# Patient Record
Sex: Female | Born: 1935 | Race: White | Hispanic: No | Marital: Married | Smoking: Never smoker
Health system: Southern US, Community
[De-identification: ages and names within clinical notes are randomized; demographics above are authoritative.]

## PROBLEM LIST (undated history)

## (undated) DIAGNOSIS — Z9989 Dependence on other enabling machines and devices: Secondary | ICD-10-CM

## (undated) DIAGNOSIS — I2510 Atherosclerotic heart disease of native coronary artery without angina pectoris: Secondary | ICD-10-CM

## (undated) DIAGNOSIS — M1990 Unspecified osteoarthritis, unspecified site: Secondary | ICD-10-CM

## (undated) DIAGNOSIS — E669 Obesity, unspecified: Secondary | ICD-10-CM

## (undated) DIAGNOSIS — G4733 Obstructive sleep apnea (adult) (pediatric): Secondary | ICD-10-CM

## (undated) DIAGNOSIS — E119 Type 2 diabetes mellitus without complications: Secondary | ICD-10-CM

## (undated) DIAGNOSIS — I5032 Chronic diastolic (congestive) heart failure: Secondary | ICD-10-CM

## (undated) DIAGNOSIS — R0600 Dyspnea, unspecified: Secondary | ICD-10-CM

## (undated) DIAGNOSIS — E785 Hyperlipidemia, unspecified: Secondary | ICD-10-CM

## (undated) DIAGNOSIS — I10 Essential (primary) hypertension: Secondary | ICD-10-CM

## (undated) DIAGNOSIS — K219 Gastro-esophageal reflux disease without esophagitis: Secondary | ICD-10-CM

## (undated) HISTORY — DX: Type 2 diabetes mellitus without complications: E11.9

## (undated) HISTORY — DX: Obstructive sleep apnea (adult) (pediatric): G47.33

## (undated) HISTORY — DX: Unspecified osteoarthritis, unspecified site: M19.90

## (undated) HISTORY — DX: Hyperlipidemia, unspecified: E78.5

## (undated) HISTORY — PX: TOTAL KNEE ARTHROPLASTY: SHX125

## (undated) HISTORY — DX: Essential (primary) hypertension: I10

## (undated) HISTORY — DX: Dependence on other enabling machines and devices: Z99.89

## (undated) HISTORY — PX: BACK SURGERY: SHX140

## (undated) HISTORY — DX: Chronic diastolic (congestive) heart failure: I50.32

## (undated) HISTORY — DX: Dyspnea, unspecified: R06.00

## (undated) HISTORY — DX: Gastro-esophageal reflux disease without esophagitis: K21.9

## (undated) HISTORY — DX: Obesity, unspecified: E66.9

## (undated) HISTORY — DX: Atherosclerotic heart disease of native coronary artery without angina pectoris: I25.10

## (undated) HISTORY — PX: CHOLECYSTECTOMY: SHX55

---

## 2000-12-20 ENCOUNTER — Encounter: Payer: Self-pay | Admitting: Orthopedic Surgery

## 2000-12-26 ENCOUNTER — Inpatient Hospital Stay (HOSPITAL_COMMUNITY): Admission: RE | Admit: 2000-12-26 | Discharge: 2000-12-30 | Payer: Self-pay | Admitting: Orthopedic Surgery

## 2002-11-10 ENCOUNTER — Inpatient Hospital Stay (HOSPITAL_COMMUNITY): Admission: RE | Admit: 2002-11-10 | Discharge: 2002-11-14 | Payer: Self-pay | Admitting: Orthopedic Surgery

## 2007-06-14 ENCOUNTER — Inpatient Hospital Stay (HOSPITAL_COMMUNITY): Admission: AD | Admit: 2007-06-14 | Discharge: 2007-06-18 | Payer: Self-pay | Admitting: Cardiology

## 2007-06-14 ENCOUNTER — Ambulatory Visit: Payer: Self-pay | Admitting: Internal Medicine

## 2007-06-14 ENCOUNTER — Ambulatory Visit: Payer: Self-pay | Admitting: Cardiology

## 2007-06-17 ENCOUNTER — Encounter: Payer: Self-pay | Admitting: Cardiology

## 2009-03-10 ENCOUNTER — Encounter: Admission: RE | Admit: 2009-03-10 | Discharge: 2009-03-10 | Payer: Self-pay | Admitting: Neurological Surgery

## 2009-04-15 ENCOUNTER — Ambulatory Visit (HOSPITAL_COMMUNITY): Admission: RE | Admit: 2009-04-15 | Discharge: 2009-04-16 | Payer: Self-pay | Admitting: Neurological Surgery

## 2011-01-08 LAB — CBC
HCT: 38.5 % (ref 36.0–46.0)
Platelets: 216 10*3/uL (ref 150–400)
RBC: 4.26 MIL/uL (ref 3.87–5.11)
WBC: 7 10*3/uL (ref 4.0–10.5)

## 2011-01-08 LAB — BASIC METABOLIC PANEL
BUN: 25 mg/dL — ABNORMAL HIGH (ref 6–23)
BUN: 27 mg/dL — ABNORMAL HIGH (ref 6–23)
CO2: 34 mEq/L — ABNORMAL HIGH (ref 19–32)
Calcium: 10.5 mg/dL (ref 8.4–10.5)
Chloride: 89 mEq/L — ABNORMAL LOW (ref 96–112)
Chloride: 89 mEq/L — ABNORMAL LOW (ref 96–112)
Creatinine, Ser: 1.17 mg/dL (ref 0.4–1.2)
GFR calc Af Amer: 55 mL/min — ABNORMAL LOW (ref >60)
GFR calc non Af Amer: 45 mL/min — ABNORMAL LOW (ref >60)
Glucose, Bld: 115 mg/dL — ABNORMAL HIGH (ref 70–99)
Potassium: 5 mEq/L (ref 3.5–5.1)
Potassium: 5.1 mEq/L (ref 3.5–5.1)
Sodium: 130 mEq/L — ABNORMAL LOW (ref 135–145)

## 2011-02-14 NOTE — Discharge Summary (Signed)
[name], [name]                ACCOUNT NO.:  [id number]   MEDICAL RECORD NO.:  [id number]          PATIENT TYPE:  INP   LOCATION:  6525                         FACILITY:  [facility]   PHYSICIAN:  Veverly Fells. Excell Seltzer, MD  DATE OF BIRTH:  [birth year]   DATE OF ADMISSION:  06/14/2007  DATE OF DISCHARGE:  06/18/2007                               DISCHARGE SUMMARY   PRIMARY CARDIOLOGIST:  Dr. Rollene Rotunda.   PRIMARY CARE PHYSICIAN:  Dr. Lois Huxley, [facility].   DISCHARGE DIAGNOSES:  Chest pain.   SECONDARY DIAGNOSES:  1. Hypertension.  2. Gastroesophageal reflux disease.  3. Hypothyroidism.  4. Acute renal insufficiency.  5. Sleep apnea, previously on CPAP.  6. History of degenerative joint disease of the neck and shoulders.  7. Status post bilateral knee replacements.  8. Status post cholecystectomy.   ALLERGIES:  No known drug allergies.   PROCEDURE:  Left heart cardiac catheterization and 2-D echocardiogram.   HISTORY OF PRESENT ILLNESS:  A 77 year old Caucasian female without a  prior history of CAD. On September 12, she developed moderately severe  substernal chest discomfort with radiation down the left arm associated  with diaphoresis prompting her to call EMS when she was given sublingual  nitroglycerin with relief of discomfort. She was taken to [facility] where she had no acute ECG changes and cardiac markers were  within normal limits. She had recurrent chest discomfort prompting  transfer to [facility] for further evaluation.   HOSPITAL COURSE:  Here at [facility], she ruled out for MI and given her risk  factors was scheduled for left heart cardiac catheterization. She had  mild chest discomfort over the weekend relieved with additional  nitroglycerin. Notably on admission to [facility] she was found to have  a creatinine of 1.7. With IV hydration, her creatinine normalized and  has remained normal. She underwent left heart cardiac catheterization on  September  15 revealing normal coronary arteries. There is a 25% stenosis  in the right renal artery. A 2-D echocardiogram was also performed  revealing an EF of 60% with mild LVH and mildly dilated left atrium. She  has had no recurrent chest discomfort and will be discharged home today  in satisfactory condition. Of note, we held her diuretics during this  admission secondary to her acute renal insufficiency. She indicates that  she has been on Torsemide for years and she has never had problems with  renal issues until she was initiated on Hyzaar approximately 3 months  ago. We have continued the Cozaar but have discontinued her  hydrochlorothiazide.   DISCHARGE LABS:  Hemoglobin 11.6, hematocrit 34.3, WBC 6.1, platelets  162, MCV 87.5. Sodium 138, potassium 4.4, chloride 105, CO2 27, BUN 11,  creatinine 1.06, glucose 104. Total bilirubin 0.5, alkaline phosphatase  50, AST 30, ALT 21, albumin 3.3. CK 49, MB 0.9, troponin I 0.02. Total  cholesterol 128, triglycerides 101, HDL 36, LDL 72, calcium 9.3.  Magnesium 1.7.   DISPOSITION:  The patient is being discontinue home today in good  condition.   FOLLOWUP:  She is asked to  followup with primary care [facility] Mink, Dr.  Earlene Plater, in approximately 1-2 weeks. We have requested that she have a  BMET drawn in 1 week.   DISCHARGE MEDICATIONS:  1. Lisinopril 40 mg b.i.d.  2. Torsemide 20 mg daily.  3. Synthroid 125 mcg daily.  4. Atenolol 50 mg b.i.d.  5. Nexium 40 mg daily.  6. Cozaar 50 mg daily.  7. Aspirin 81 mg daily.  8. Tylenol p.r.n.  9. Multiple daily.  10.Vitamin E, calcium, hydrocodone all as previously prescribed.   OUTSTANDING LAB STUDIES:  None.   DURATION OF DISCHARGE ENCOUNTER:  60 minutes including physician time.      Nicolasa Ducking, ANP      Veverly Fells. Excell Seltzer, MD  Electronically Signed    CB/MEDQ  D:  06/18/2007  T:  06/18/2007  Job:  [id number]   cc:   Lois Huxley, MD

## 2011-02-14 NOTE — Op Note (Signed)
NAMEMADLYNN, LUNDEEN                ACCOUNT NO.:  [id number]   MEDICAL RECORD NO.:  [id number]          PATIENT TYPE:  INP   LOCATION:  3524                         FACILITY:  [facility]   PHYSICIAN:  Stefani Dama, M.D.  DATE OF BIRTH:  [birth year]   DATE OF PROCEDURE:  DATE OF DISCHARGE:                               OPERATIVE REPORT   PREOPERATIVE DIAGNOSIS:  Herniated nucleus pulposus, L4-L5, right with  right lumbar radiculopathy.   POSTOPERATIVE DIAGNOSIS:  Herniated nucleus pulposus, L4-L5, right with  right lumbar radiculopathy.   PROCEDURE:  Microdiskectomy, L4-L5, right with operating microscope,  microdissection technique.   SURGEON:  Barnett Abu, MD   FIRST ASSISTANT:  Coletta Memos, MD   ANESTHESIA:  General endotracheal.   INDICATIONS:  Keyshia is a 77 year old individual who has had significant  back and right lower extremity pain for a number of months now.  It is  only been getting worse despite efforts with conservative treatment and  passage of time.  She is advised regarding surgical decompression via  microdiskectomy at L4-L5 on her right.   PROCEDURE:  The patient was brought to the operating room, supine on the  stretcher.  After smooth induction of general endotracheal anesthesia,  she was turned prone .  Back was prepped with alcohol, and DuraPrep, and  draped in a sterile fashion.  Midline incision was created and carried  down to the lumbodorsal fascia, which was opened on the left side at the  L4-L5 level, identified positively with a radiograph.  Then, with the  laminar arch of L4 being identified, and laminotomy was created removing  the inferior marginal lamina of L4 up to and including a proximal  facetectomy on the medial aspect of L4-L5.  Once this was completed,  then the yellow ligament was within the common dural tube could be  identified.  On the lateral aspect of the common dural tube, the disk  space was known to have a bulge posteriorly and  inferiorly, ligament was  intact.  Gently dissecting and lifting the L5 nerve root immediately  underneath this and medial to it we could identify a fragment of disk as  poking through a random lying posterior longitudinal ligament.  With  gentle teasing, this was mobilized and then evacuated as a single  fragment.  Further identification yielded some other scraps of disk  material; however, the opening from which the disk had protruded was  very tight and close to the disk space itself.  Further probing yielded  no other fragments of disk.  At this point, we decided to complete the  procedure without doing a formal intradiskal diskectomy and with  hemostasis in the soft tissues being, the lumbodorsal fascia was closed  with #1 Vicryl in an interrupted fashion, 2-0 Vicryl was used in the  subcutaneous tissues, 3-0 Vicryl subcuticularly, and Dermabond was  placed on the skin.  Blood loss for this procedure is quite nil.     Stefani Dama, M.D.  Electronically Signed    HJE/MEDQ  D:  04/15/2009  T:  04/16/2009  Job:  [id number]

## 2011-02-14 NOTE — Consult Note (Signed)
[name], [name]                ACCOUNT NO.:  [id number]   MEDICAL RECORD NO.:  [id number]          PATIENT TYPE:  INP   LOCATION:  2920                         FACILITY:  [facility]   PHYSICIAN:  Rollene Rotunda, MD, FACCDATE OF BIRTH:  07/12/1932   DATE OF CONSULTATION:  06/17/2007  DATE OF DISCHARGE:                                 CONSULTATION   PROCEDURES:  Left heart catheterization/coronary arteriography and  abdominal aortogram.   INDICATIONS:  Evaluate patient with chest pain suggestive of unstable  angina.  She also has refractory hypertension with renal insufficiency  suggesting the possibility of renal artery stenosis.   PROCEDURAL NOTE:  Left heart catheterization performed via right femoral  artery.  The artery is cannulated using anterior wall puncture.  A #6  French arterial sheath was inserted via the modified Seldinger  technique.  Preformed Judkins and a pigtail catheter were utilized.  The  patient tolerated the procedure well, and left the lab in stable  condition.  Results:  Hemodynamics LV 151/20, AO 152/93.  Coronaries:  The left main was normal.  The LAD was large and wrapped the apex and  was normal.  There were three small diagonals which were normal.  The  circumflex in the AV groove had a 25% stenosis after first obtuse  marginal.  The first obtuse marginal was large and normal.  Second  obtuse marginal was moderate sized and normal.  The right coronary  artery was a large dominant vessel.  It was normal throughout its  course.  PDA was large and normal.  Left ventriculogram:  The left  ventricle was not injected though we crossed for pressures.  I did not  inject the LV gram secondary to renal insufficiency.  An abdominal  aortogram was obtained secondary to her renal insufficiency and  difficult to control hypertension.  This demonstrated minimal aortic  plaque in the infrarenal region above the bifurcation.  Her right renal  artery had a mild 25%  plaque, but was, otherwise, widely patent.   CONCLUSION:  Minimal coronary artery plaque.  Minimal renal artery  plaque.   PLAN:  No further cardiovascular workup is suggested.  She will continue  the medications as listed for control of her hypertension.  Will refer  her back to Dr. Earlene Plater for evaluation of nonanginal chest pain.      Rollene Rotunda, MD, [facility]  Electronically Signed     JH/MEDQ  D:  06/17/2007  T:  06/17/2007  Job:  [id number]   cc:   Lois Huxley, M.D.

## 2011-02-14 NOTE — Consult Note (Signed)
[name], [name]                ACCOUNT NO.:  [id number]   MEDICAL RECORD NO.:  [id number]          PATIENT TYPE:  INP   LOCATION:  2920                         FACILITY:  [facility]   PHYSICIAN:  Rollene Rotunda, MD, FACCDATE OF BIRTH:  Jun 01, 1932   DATE OF CONSULTATION:  06/14/2007  DATE OF DISCHARGE:                                 CONSULTATION   PRIMARY CARE PHYSICIAN:  Dr. Lois Huxley, [facility]   REASON FOR ADMISSION:  Evaluate patient with chest pain.   HISTORY OF PRESENT ILLNESS:  The patient is a pleasant 77 year old white  female without prior cardiac history.  She presents for evaluation of  chest discomfort that started Thursday a.m.Marland Kitchen  She was sitting quietly.  She had been helping her husband, who has had recent bypass.  She  developed severe substernal chest discomfort.  She described it as a  aching discomfort.  She was somewhat diaphoretic.  It was moderately  severe at 5/10.  It was not like a previous reflux.  Radiated down both  arms.  She called EMS and was given four baby aspirin.  She was  subsequently given nitroglycerin.  She had several doses and eventually  the pain eased away.  She was taken to [facility], where she was  not noted to have any acute EKG changes.  Enzymes were within normal  limits, though her troponin did slightly increase on serial  measurements.  She had recurrent chest discomfort in the evening last  night requiring IV morphine.  Because of this she is transferred.  On  transfer here she is having 2/10 chest discomfort.   The patient denies any recent history.  She walks with her husband a  little as he recovers from his surgery.  She has not been able to bring  on any chest discomfort with this.  She has not had any new shortness of  breath, PND or orthopnea.  She has had no palpitations, presyncope or  syncope.   PAST MEDICAL HISTORY:  Hypertension times 2 years, apparently difficult  to control, gastroesophageal reflux  disease, hypothyroidism, chronic  kidney disease (I am not sure of the etiology of this.  Her creatinine  appears to be about 1.7.)  Sleep apnea with C-PAP, neck and shoulder  chronic degenerative joint disease.   PAST SURGICAL HISTORY:  Bilateral knee replacements, cholecystectomy.   ALLERGIES:  None.   MEDICATIONS AT HOME:  Lisinopril 40 mg b.i.d., torsemide 20 mg daily,  Synthroid 125 mcg daily, atenolol 50 mg b.i.d., Nexium 40 mg daily,  Hyzaar 100/12.5 daily, aspirin 325 mg daily, Tylenol, multivitamin,  vitamin E, calcium, hydrocodone.   SOCIAL HISTORY:  The patient is married.  She has four children.  She  never smoked cigarettes.  She does not drink alcohol.   FAMILY HISTORY:  Noncontributory for early coronary artery disease.  Her  mother died at 28 from coronary disease.   REVIEW OF SYSTEMS:  As stated in HPI, otherwise negative for other  systems.   PHYSICAL EXAMINATION:  The patient is well-appearing and in no distress.  Blood pressure 130/52, heart  rate 61 and regular, afebrile, respiratory  rate 18.  HEENT:  Eyes unremarkable, pupils equal, round, reactive to  light, fundi not visualized, oral mucosa unremarkable.  NECK:  No  jugular distention at 45 degrees, carotid upstroke brisk and  symmetrical.  No bruits, no thyromegaly.  LYMPHATICS:  No cervical,  axillary, inguinal adenopathy.  LUNGS:  Clear to auscultation  bilaterally.  BACK:  No costovertebral angle tenderness.  CHEST:  Unremarkable.  HEART:  PMI not displaced or sustained, S1-S2 within  normal limits.  No S3, no S4, clicks, rubs, murmurs.  ABDOMEN:  Obese,  positive bowel sounds, normal in frequency and pitch, no bruits,  rebound, guarding or midline pulsatile mass.  No hepatomegaly, no  splenomegaly.  SKIN:  No rashes, no nodules.  EXTREMITIES:  2+ pulses  throughout, no edema, cyanosis, clubbing.  NEURO:  Oriented to person,  place, time, cranial nerves II-XII grossly intact, motor grossly intact.    LABS:  WBC 5.6, hemoglobin 13.1, platelets 182, sodium 138, potassium  3.7, BUN 29, creatinine 1.7, CK-MB x2 negative.   CHEST X-RAY:  Preliminary in the hospital from [facility] was normal.   EKG:  EKG on admission:  Sinus rhythm, rate 60, axis within normal  limits, intervals within normal limits, poor anterior R-wave  progression, no acute ST-T wave changes.   ASSESSMENT/PLAN:  1. Chest.  The patient's chest discomfort is very worrisome for      cardiac etiology.  Because of this and the recurrent nature of it,      she needs cardiac catheterization, which will be done electively      unless she has an urgent indication.  She will be treated with      heparin, IV nitroglycerin, beta blockers and aspirin.  She will      continue to have enzymes cycled.  Would check an a.m. EKG.  2. Renal insufficiency.  The patient will need careful attention paid      to her renal insufficiency and hydration prior to the procedure.  I      will order an echocardiogram for Monday morning prior to the cath      and will try to avoid a left ventriculogram.  I will continue the      ACE and the ARB now as I think she needs this for blood pressure      control.  I would suggest an abdominal aortogram to assess her      renal arteries given the recent onset severe hypertension and the      renal insufficiency.  3. Hypothyroidism.  She will be continued on the Synthroid.  Will      check a TSH if one has not been ordered.  4. Hypertension.  This be managed with meds as listed.  Hold her      diuretic and hydrochlorothiazide for now.  5. Sleep apnea.  She will be watched for this, but we might write for      C-PAP as needed.      Rollene Rotunda, MD, [facility]  Electronically Signed     JH/MEDQ  D:  06/14/2007  T:  06/15/2007  Job:  [id number]

## 2011-02-17 NOTE — Discharge Summary (Signed)
NAME:  [name], [name]                     ACCOUNT NO.:  [id number]   MEDICAL RECORD NO.:  [id number]                   PATIENT TYPE:  INP   LOCATION:  0474                                 FACILITY[facility]  [facility]   PHYSICIAN:  Ollen Gross, M.D.                 DATE OF BIRTH:  1932-01-12   DATE OF ADMISSION:  11/10/2002  DATE OF DISCHARGE:  11/14/2002                                 DISCHARGE SUMMARY   ADMISSION DIAGNOSES:  1. Osteoarthritis right knee.  2. Status post left total knee replacement arthroplasty in 3/02.  3. Hypertension.  4. Hypothyroidism.  5. Osteoporosis.  6. Postmenopausal.   DISCHARGE DIAGNOSES:  1. Osteoarthritis right knee.  2. Status post right total knee replacement arthroplasty.  3. Hypertension.  4. Hypothyroidism.  5. Osteoporosis.  6. Postmenopausal.   PROCEDURE:  On 11/10/02 the patient underwent a right total knee arthroplasty.   SURGEON:  Dr. Lequita Halt.   ASSISTANT:  Alexzandrew L. Perkins, P.A.-C.   ANESTHESIA:  Spinal anesthesia.   BLOOD LOSS:  Minimal.  Hemovac Drain x1.   TOURNIQUET TIME:  42 minutes at 300 mm Hg.   CONSULTATIONS:  None.   HISTORY:  A 77 year old female well known to Dr. Lequita Halt who had  improvements on her left total knee back in 3/02.  She has done quite well  on her left knee.  Unfortunately she had ongoing right knee pain.  She has  undergone injections which only provided short term relief.  She toughed it  out through the holidays.  She has reached it out where she would like to  have something done.  X-rays show significant arthritis and she is  subsequently admitted to the hospital for knee replacement.  The risks and  benefits were discussed.  The patient has elected to proceed with surgery.   LABORATORY DATA:  CBC  on admission had a hemoglobin of 13.0, hematocrit of  38.0, white cell count of 5.5, red cell count of 4.32.  Postoperative H&H  12.7 and 37.4.  Last noted H&H 10.8 and 31.4.  Differential on  admission CBC  all within normal limits.  PT and PTT on admission 12.3 and 30 respectively.  The INR was 0.9.  __________ followed.  The last noted PT INR 21.8 and 2.2.  Chemistry panel on admission all within normal limits.  The patient had a  drop in sodium postoperatively from 143 of 135.  It was last noted at 132.  Glucose went up from 107 to 169 back down to 135.  Urinalysis on admission  showed only trace leukocyte esterase and 0-2 white cells, a few bacteria.  Blood type A positive.  I do not see a chest x-ray or EKG on this chart.   HOSPITAL COURSE:  The patient was admitted to [facility] and taken  to the OR.  She underwent the above procedure without complications.  The  patient tolerated the procedure  well.  She was given PCA and p.o. analgesics  for pain control following surgery.  She was given 24 hours postoperative  unit IV antibiotics and postoperative Coumadin for DVT prophylaxis.  Hemovac  drain placed at surgery was pulled on postoperative day #1.  Dressing change  was initiated on postoperative day #2.  The incision was healing well.  The  patient progressed fairly well with physical therapy and was up ambulating  44 feet by postoperative day #2, then 50 feet that afternoon, then up to 100  and 140 feet by postoperative day #3.  She progressed very well with  physical therapy.  Arrangements were made for home therapy.  By 11/14/02 she  was doing quite well, progressed with therapy, and was ready to go home.  Discharge medications were planned.  The patient was discharged home on  11/14/02.   DISCHARGE MEDICATIONS:  1. Coumadin.  2. Percocet.  3. Robaxin.   DISCHARGE DIET:  Low sodium diet.   FOLLOW UP:  Two weeks from surgery.   DISCHARGE ACTIVITIES:  Weight-bearing as tolerated.  Total knee protocol.  Home PT nursing through [facility].  She may start showering.   DISPOSITION:  Home.   CONDITION ON DISCHARGE:  Improved.      Alexzandrew L.  Julien Girt, P.A.              Ollen Gross, M.D.    ALP/MEDQ  D:  12/15/2002  T:  12/15/2002  Job:  [id number]

## 2011-02-17 NOTE — Op Note (Signed)
NAME:  RAELYNN, CORRON                     ACCOUNT NO.:  [id number]   MEDICAL RECORD NO.:  [id number]                   PATIENT TYPE:  INP   LOCATION:  0005                                 FACILITY:  [facility]   PHYSICIAN:  Ollen Gross, M.D.                 DATE OF BIRTH:  Aug 02, 1932   DATE OF PROCEDURE:  11/10/2002  DATE OF DISCHARGE:                                 OPERATIVE REPORT   PREOPERATIVE DIAGNOSIS:  Osteoarthritis of her right knee.   POSTOPERATIVE DIAGNOSIS:  Osteoarthritis of her right knee.   PROCEDURE:  Right total knee arthroplasty.   SURGEON:  Ollen Gross, M.D.   ASSISTANT:  Alexzandrew L. Julien Girt, P.A.   ANESTHESIA:  Spinal.   ESTIMATED BLOOD LOSS:  Minimal.   DRAINS:  Hemovac x1.   TOURNIQUET TIME:  42 minutes at 300 mmHg.   COMPLICATIONS:  None.   CONDITION:  Stable to recovery.   BRIEF CLINICAL NOTE:  The patient is a 77 year old female with severe end-  stage osteoarthritis of her right knee with pain refractory to nonoperative  management.  She presents now for right total knee arthroplasty.   DESCRIPTION OF PROCEDURE:  After the successful administration of a spinal  anesthetic, a tourniquet was placed high in the right thigh, the right lower  extremity prepped and draped in the usual sterile fashion.  The extremity  was wrapped in an Esmarch, knee flexed, and tourniquet inflated to 350 mmHg.  Standard midline incision was made with a 10 blade through subcutaneous  tissue to the level of the extensor mechanism.  A fresh blade was used to  make a medial parapatellar arthrotomy, and then the soft tissue over the  proximal and medial tibia was subperiosteally elevated at the joint line  with a knife and into the semimembranosus bursa with a curved osteotome.  Soft tissue over the proximal lateral tibia was also elevated with attention  being paid to avoid the patellar tendon on the tibial tubercle.  The patella  was everted and the knee  flexed 90 degrees, ACL and PCL removed.  A drill  was used to create a starting hole in the distal femur.  The canal was  irrigated and a 5 degree right valgus alignment guide placed.  Referencing  off the posterior condyles, the rotation was marked and the block pinned to  remove 10 mm off the distal femur.  Distal femoral resection was made with  an oscillating saw.   A sizing block is placed and size 3 is most appropriate.  Referencing off  the epicondylar axis, rotation is marked and a block pinned to make the  anterior and posterior cuts.  The cuts are subsequently made and then the  tibia is subluxed forward.  The menisci are removed and the extramedullary  tibial alignment guide is placed.  Referencing proximally to the medial  aspect of the tibial tubercle and distally along the second  metatarsal axis  and tibial crest, the block is pinned to remove 10 mm off the nondeficient  lateral side.  Tibial resection is made with an oscillating saw and size is  the most appropriate tibial tray.  The proximal tibia is then prepared with  a modular drill and keel punch.  The femoral preparation was then completed  with the intercondylar and chamfer block, and those cuts are subsequently  made.   A size 3 mobile bearing tibial tray trial, size 3 posterior-stabilized  femoral trial, and a 10 mm posterior-stabilized rotating platform insert  trial were placed.  Full extension is achieved with excellent varus and  valgus balance throughout the full range of motion.  The patella thickness  is measured and is 22 mm, and then free-hand resection is made with an  oscillating saw to 12 mm.  The 38 template is placed, lug holes drilled,  trial patella placed, and it tracks normally.   The trials removed with the exception of the femoral trial.  With that in  place, the osteophytes are removed off the posterior femur.  All trials are  removed and then the cut bone surfaces prepared with pulsatile  lavage.  Cement is mixed and once ready for implantation, a size 3 mobile bearing  tibial tray, size 3 posterior-stabilized femur, and 38 patella are cemented  into place.  The patella is held with a clamp.  The 10 mm trial is placed  and the knee held in full extension and all extruded cement removed.  Once  cement is fully hardened, then the permanent 10 mm posterior-stabilized  rotating platform insert is placed.  The wound is copiously irrigated with  antibiotic solution and the extensor mechanism closed over a Hemovac drain  with interrupted #1 PDS suture.  Flexion against gravity is 130 degrees.  The tourniquet is then released for a total time of 42 minutes.  The subcu  is closed with interrupted 2-0 Vicryl and subcuticular running 4-0 Monocryl.  The incision is cleaned and dried and Steri-Strips and a bulky sterile  dressing applied.  The patient is then awakened and transported to recovery  in stable condition.                                               Ollen Gross, M.D.    FA/MEDQ  D:  11/10/2002  T:  11/10/2002  Job:  [id number]

## 2011-02-17 NOTE — H&P (Signed)
[facility]  Patient:    [name], [name]                         MRN: [id number] Adm. Date:  12/20/00 Attending:  Ollen Gross, M.D. Dictator:   Ollen Gross, M.D. CC:         Dr. Jolyne Loa, [facility]   History and Physical  CHIEF COMPLAINT:  Bilateral knee pain.  HISTORY OF PRESENT ILLNESS:  The patient is a 77 year old female who has been seen in consultation by Dr. Ollen Gross for bilateral knee pain.  She is found to have bilateral osteoarthritis, the left is more symptomatic than the right.  This has been ongoing for over a year now.  She is seen in the office and x-rays show pretty significant joint space narrowing medially of the left knee and also the right.  It is more pronounced in the left knee.  She also has some osteophyte formation with bone on bone changes the patellar femoral joints bone knees.  She has tried anti-inflammatories in the past and she has also undergo interarticular injections as per Dr. Lequita Halt.  The injections only helped for a very short period of time and she wishes to have something done about her continued pain which has been ongoing.  The pain has started to interfere with her daily activities and her lifestyle.  Risk and benefits of total knee procedure have been discussed with the patient and she has elected to proceed with a left total knee replacement arthroplasty.  Surgery will be performed by Dr. Ollen Gross.  ALLERGIES:  No known drug allergies.  CURRENT MEDICATIONS: 1. Synthroid .112 mg daily. 2. Actonel 5 mg daily. 3. Spironolactone 25 mg daily. 4. Atenolol 25 mg 1/2 tablet daily. 5. Tylenol Arthritis p.r.n.  PAST MEDICAL HISTORY:  Hypertension, hypothyroidism, osteoporosis and arthritis.  PAST SURGICAL HISTORY:  Cholecystectomy back in July of 1999 and also tubal ligation.  SOCIAL HISTORY:  She is married with four children.  Denies the use of tobacco products or  alcohol products.  She lives in a two-story home.  She does have a ramp entering into the home.  FAMILY HISTORY:  Father living age 60 with diabetes.  Mother living age 72 with hypertension.  REVIEW OF SYSTEMS:  General:  No fever, chills or night sweats.  Neurologic: No seizures, syncope or paralysis.  Respiratory:  Shortness of breath, no productive cough or hemoptysis.  Cardiovascular:  No chest pain, angina, orthopnea.  GI:  No nausea, vomiting or diarrhea, constipation or bloody mucous in the stool.  GU:  No dysuria, hematuria, or discharge. Musculoskeletal:  Pertinent that the bilateral knees found in the history of present illness.  PHYSICAL EXAMINATION:  VITAL SIGNS:  Pulse 68, respirations 20, blood pressure 158/90.  GENERAL:  The patient is a 77 year old white female, short in stature, slightly overweight, in no acute distress.  She is alert, oriented and cooperative and very pleasant at time of exam.  She appears to be an average historian.  HEENT:  Normocephalic, atraumatic.  Pupils are round and reactive.  Patient is noted to wear glasses.  Oropharynx is clear.  She is noted to have upper and lower partial dentures.  NECK:  Supple, no carotid bruits.  CHEST:  Clear to auscultation.  HEART:  Regular rate and rhythm, no murmur, S1, S2 noted.  ABDOMEN:  Soft, round, nontender.  Bowel sounds are present.  No rebound or guarding.  BREAST AND GENITALIA:  Not done, not pertinent to present illness.  LEFT LOWER EXTREMITY:  She has range of motion of 5 to 115 degrees of passive range of motion.  She has marked crepitus noted on exam of the left knee. Ligaments do appear stable.  The right knee is noted to have 0 to 115 passive range of motion with marked crepitus and also stable ligaments.  X-RAYS:  X-rays do reveal joint space narrowing medially both in the left and right knee, more pronounced in the left.  She also has some osteophyte formation in bone on bone  changes the patellar femoral compartment on the lateral views.  IMPRESSION: 1. Bilateral knee osteoarthritis, left more symptomatic than right. 2. Hypertension. 3. Hypothyroidism. 4. Osteoporosis.  PLAN:  Patient will be admitted to [facility] to undergo a left total knee replacement arthroplasty.  Surgery is to be performed by Dr. Ollen Gross. DD:  12/21/00 TD:  12/22/00 Job: [id number]

## 2011-02-17 NOTE — Op Note (Signed)
[facility]  Patient:    [name], [name]                  MRN: [id number] Proc. Date: 12/26/00 Adm. Date:  [id number] Attending:  Ollen Gross V                           Operative Report  PREOPERATIVE DIAGNOSIS:  Osteoarthritis, left knee.  POSTOPERATIVE DIAGNOSIS:  Osteoarthritis, left knee.  PROCEDURE:  Left total knee arthroplasty.  SURGEON:  Dr. Lequita Halt.  ASSISTANT:  Avel Peace.  ANESTHESIA:  Spinal.  ESTIMATED BLOOD LOSS:  Minimal.  DRAINS:  Hemovac x 1.  TOURNIQUET TIME:  56 minutes at 350 mmHg.  COMPLICATIONS:  None.  CONDITION:  Stable to recovery.  BRIEF CLINICAL NOTE:  Ms. Snoke is a 77 year old female who had significant osteoarthritis to the left knee with pain refractory to non-operative management. She presents now for left total knee arthroplasty.  DESCRIPTION OF PROCEDURE:  After successful administration of spinal anesthetic, a tourniquet was placed high on the left thigh and left lower extremity prepped and draped in the usual sterile fashion. Extremities wrapped in Esmarch, knee flexed and tourniquet inflated to 350 mmHg. A midline incision is made, skin cut through a large thick layer of subcu tissue down to the extensor mechanism. A medial parapatellar arthrotomy is made with a fresh knife. The soft tissue over the proximal medial tibia subperiosteally elevated to the joint line with the knife and at the semimembranosus bursa with a curved osteotome. The soft tissue over the proximal lateral tibia is also elevated with attention being paid to avoid the patella tendon the tibial tubercle. The lateral patellofemoral ligament is cut, patella everted and knee flexed to 90 degrees. The ACL and PCL were then removed.  A drill was used to create a hole in the distal femur with the alignment guide. The canal was irrigated and a 5 degree left valgus alignment was placed. Referencing off the posterior condyle, rotation  is marked and 9 mm removed off the distal femur. A sizing block is placed and a size 3 is most appropriate. ______ external rotation guide is placed up the posterior condyle and this corresponds with her epicondylar axis. The size 3 block is placed and the anterior and posterior cuts made.  Tibia subluxed forward, menisci removed and extramedullary tibial alignment guide placed. Referencing proximally at the medial third of the tibial tubercle and distally along the tibial crest and second metatarsal axis is blocked, it was pinned to removed 10 mm off the nondeficient lateral side. Tibial resection is made with an oscillating saw. The chamfer block is then placed on the distal femur, intercondylar and chamfer cuts made. Trial size 3 posterior stabilized femur, size 3 mobile bearing tibial tray and a 10 mm posterior stabilized mobile bearing insert placed. Full extension achieved with excellent varus and valgus balance down past 100 degrees of flexion. The patella was then everted, thickness measured to be 22 and free hand resection taken down to 12 mm. A 38 mm template is placed and the lug holes are drilled. A 38 mm trial is placed and tracks perfectly.  The proximal tibia is prepared with the modular drill and keel punch and the osteophytes removed off the posterior femur with the trial in place. The cut bone surfaces are then prepared with pulsatile lavage, cement mixed and once ready for implantation, size 3 mobile bearing tibial tray, size 3 posterior  stabilized femur and a 38 mm patella are cemented into place and the patella is held with the clamp. A 10 mm posterior stabilized rotating platform trial is placed, knee held with full extension and all extruded cement removed. Once the cement is hardened, the permanent size 3 posterior stabilized mobile bearing insert is placed into the tibial tray. The wound is then copiously irrigated with antibiotic solution and extensor mechanism  closed over Hemovac drain with #1 PDS interrupted. The tourniquet is released with a total time of 56 minutes. The subcu is closed in two layers with interrupted 2-0 Vicryl, subcuticular with running 4-0 monocryl. The incision is cleaned and dried and Steri-Strips with a bulky sterile applied. Drains hooked to suction, knee immobilizer placed, patient awakened and transported to recovery room in stable condition. DD:  12/26/00 TD:  12/27/00 Job: [id number]

## 2011-02-17 NOTE — H&P (Signed)
NAME:  [name], [name]                     ACCOUNT NO.:  [id number]   MEDICAL RECORD NO.:  [id number]                   PATIENT TYPE:  INP   LOCATION:  0474                                 FACILITY:  [facility]   PHYSICIAN:  Ollen Gross, M.D.                 DATE OF BIRTH:  08/05/1932   DATE OF ADMISSION:  11/10/2002  DATE OF DISCHARGE:                                HISTORY & PHYSICAL   DATE OF OFFICE VISIT HISTORY AND PHYSICAL:  November 04, 2002.   CHIEF COMPLAINT:  Right knee pain.   HISTORY OF PRESENT ILLNESS:  The patient is a 77 year old female known to  Dr. Lequita Halt having previously undergone a left total knee replacement  arthroplasty back in March 2002.  She has done well with her left knee  replacement but has been followed recently for ongoing right knee pain.  The  right knee pain has been markedly increased.  She has undergone  intraarticular injections in the past with only a short-term temporary  relief.  She has toughed it out through the holidays and now has had a big  increase in her pain which is also not only painful during the day but  throughout the night.  She has reached a point where she would like to have  something done about it.  She comes in.  X-rays show significant medial  joint space narrowing with patellofemoral spurring.  It is felt she has  reached a point where she would benefit from undergoing knee replacement.  The risks and benefits have been discussed with the patient, and she has  elected to proceed with surgery.   ALLERGIES:  No known drug allergies.   CURRENT MEDICATIONS:  1. Synthroid 0.112 mg 1 p.o. daily.  2. Spironolactone 25 mg 2 tablets daily.  3. Atenolol 50 mg 1 tablet daily.  4. Actonel 35 mg 1 tablet weekly on Sunday.   PAST MEDICAL HISTORY:  1. Hypertension.  2. Hypothyroidism.  3. Osteoporosis.  4. Postmenopausal.   PAST SURGICAL HISTORY:  1. Gallbladder surgery in 1999 and also in July 2000 for gallstones.  2.  Left knee replacement March 2002.   SOCIAL HISTORY:  She has been married approximately 51 years.  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  Nonsmoker.  No alcohol.  Has four children.  Her husband will be assisting with her care after surgery.  One-story home.  She does have a ramp entering into her home.   FAMILY HISTORY:  Father deceased age 88, history of diabetes.  Mother, age  98, history of hypertension and arthritis.  She has a sister with breast  cancer, a brother with colon cancer.   REVIEW OF SYSTEMS:  GENERAL:  No fevers, chills, or night sweats.  NEUROLOGIC:  No seizures, syncope, or paralysis.  RESPIRATORY:  No shortness  of breath, productive cough, or hemoptysis.  CARDIOVASCULAR:  No chest pain,  angina, or orthopnea.  GASTROINTESTINAL:  No nausea, vomiting, diarrhea, or  constipation.  GENITOURINARY:  No dysuria, hematuria, or discharge.  MUSCULOSKELETAL:  Pertinent to that of the right knee found in the history  of present illness.   PHYSICAL EXAMINATION:  VITAL SIGNS:  Pulse 60, respirations 12, blood  pressure 162/84.  GENERAL:  The patient is a 77 year old female.  Well-nourished, well-  developed, short in stature.  In no acute distress.  She is alert, oriented,  and cooperative.  Very pleasant at the time of exam.  Appears to be an  average historian.  HEENT:  Normocephalic, atraumatic.  Pupils round and reactive.  The patient  is noted to wear glasses.  Oropharynx clear.  She does have upper and lower  dentures noted.  NECK:  Supple.  No carotid bruits.  CHEST:  Clear to auscultation anterior and posterior chest walls.  No  rhonchi, rales, or wheezing.  HEART:  Regular rate and rhythm.  No murmur.  S1, S2 noted.  ABDOMEN:  Soft and nontender.  Bowel sounds are present.  No rebound or  guarding.  RECTAL, BREASTS, GENITALIA:  Not done.  Not pertinent to present illness.  EXTREMITIES:  Limited to that of the right lower extremity.  The right knee  is found  to have range of motion of 10-105 degrees.  Marked crepitus is  noted on passive range of motion.  There is no instability.  The knee is  stable with varus and valgus stressing.   LABORATORY DATA:  X-rays show significant medial joint space narrowing with  patellofemoral spurring.   IMPRESSION:  1. Osteoarthritis right knee.  2. Status post left total knee replacement arthroplasty March 2002.  3. Hypertension.  4. Hypothyroidism.  5. Osteoporosis.  6. Postmenopausal.   PLAN:  The patient will be admitted to [facility] to undergo a  right total knee replacement arthroplasty.  Surgery will be performed by Dr.  Ollen Gross.     Alexzandrew L. Julien Girt, P.A.              Ollen Gross, M.D.    ALP/MEDQ  D:  11/10/2002  T:  11/10/2002  Job:  [id number]   cc:   Wannetta Sender.  [address]  [place]  [place] [address]  Fax: [phone]

## 2011-02-17 NOTE — Discharge Summary (Signed)
[facility]  Patient:    [name], [name]                  MRN: [id number] Adm. Date:  [id number] Disc. Date: [id number] Attending:  Loanne Drilling Dictator:   Alexzandrew L. Perkins, P.A.-C.                           Discharge Summary  ADMITTING DIAGNOSES: 1. Bilateral knee osteoarthritis, left more symptomatic than right. 2. Hypertension. 3. Hypothyroidism. 4. Osteoporosis.  DISCHARGE DIAGNOSES: 1. Osteoarthritis, left knee, status post left total knee replacement and    arthroplasty. 2. Osteoarthritis, right knee. 3. Hypertension. 4. Hypothyroidism. 5. Osteoporosis.  DESCRIPTION OF PROCEDURE:  Patient was taken to the OR on December 26, 2000, underwent a left total knee replacement arthroplasty.  Surgeon - Dr. Homero Fellers Aluisio.  Assistant - Patrica Duel, P.A.-C.  Surgery done under spinal anesthesia.  Hemovac drain x 1 placed at time of surgery.  Tourniquet time 56 minutes at 350 mmHg.  CONSULTS:  None.  HISTORY OF PRESENT ILLNESS:  Patient is a 77 year old female who had been seen in consultation by Dr. Lequita Halt for bilateral knee pain.  The left is more symptomatic than the right at this time.  The knee pain has been going on for over a year now.  She has been seen in the office where x-rays showed pretty significant joint space narrowing medially on the left knee and also on the right.  X-ray changes are more pronounced on the left with osteophyte formation and bone-on-bone changes in the patellofemoral joints of both knees who has been on anti-inflammatories in the past and has also undergone intra-articular injections.  The injections only help for a short period of time.  She has continued with pain and has elected to proceed with total knee replacement arthroplasty.  LABORATORY DATA:  CBC on admission showed a hemoglobin of 13.4, hematocrit 39.8, white cell count 7.6, red cell count 4.41.  Differential within normal limits.   Serial H&Hs were followed throughout the hospital course. Postoperative H&H was 11.3 and 33.8.  Last noted H&H was 10.1 and 29.8.  PT, PTT on admission were 13.1 and 29 respectively with an INR of 1.0.  Serial pro times followed.  Last noted PT-INR were 20.2 and 2.2 respectively.  Chemistry panel on admission:  Slightly elevated glucose of 136, remaining chemistry panel all within normal limits.  BMET taken two more times on December 27, 2000, and December 28, 2000, were within normal limits with the exception of mildly elevated glucose of 142 and 116 respectively.  Urinalysis on admission:  Small leukocyte esterase with only 0 to 5 white cells per high-power field, otherwise negative.  Blood group type A positive.  I do not see a chest x-ray or an EKG report on this chart volume.  HOSPITAL COURSE:  Patient was admitted to [facility], taken to the OR, underwent the above stated procedure without complication.  Patient tolerated the procedure well, later transferred to the recovery room and, then, to the orthopedic floor, continued postoperative care.  Hemovac drain was pulled on postoperative day #1.  She did have some mild fluid overload and she underwent mild diuresis.  Electrolytes were followed.  Electrolytes remained within normal limits.  She was placed on PCA analgesics for pain control following surgery and underwent 24 hours of IV antibiotics.  PCA and IV were discontinued on postoperative day #2.  Dressing changes  were initiated.  On postoperative day 2, wound appeared to be healing well.  It was noted on postoperative day #3 that she had a small decubitus ulceration on the right buttock.  DuoDerm was placed at this area.  Physical therapy was consulted postoperatively to assist with gait training and ambulation. Patient did very well with physical therapy and was up ambulating approximately 65 feet by postoperative day #3 and greater than 90 feet prior to discharge.  She  continued to progress well, had been weaned over to p.o. analgesics and, by postoperative day #4, she was doing quite well and ready to go home.  DISCHARGE MEDICATIONS/PLAN: 1. Patient is discharged home on December 30, 2000. 2. Discharge diagnosis:  Please see above. 3. Discharge medications:    a. Percocet #40 p.r.n. pain.    b. Robaxin 500 mg #40 p.r.n. spasm.    c. Coumadin.    d. Trinsicon, #63, t.i.d. for three more weeks.    e. Phenergan, #15, p.r.n. nausea. 4. Diet:  Low-sodium diet. 5. Activity:  Home health physical therapy and home health nursing for    prothrombin time draws.  Prothrombin times will be faxed to Dr. Lequita Halt.    He will be monitoring her Coumadin for three weeks.  She is also    weightbearing as tolerated.  FOLLOW-UP:  Follow up with Dr. Lequita Halt two weeks from date of surgery.  DISPOSITION:  Home.  CONDITION ON DISCHARGE:  Improved. DD:  02/05/01 TD:  02/06/01 Job: [id number]

## 2011-06-13 ENCOUNTER — Other Ambulatory Visit: Payer: Self-pay | Admitting: Family Medicine

## 2011-06-13 DIAGNOSIS — N63 Unspecified lump in unspecified breast: Secondary | ICD-10-CM

## 2011-06-19 ENCOUNTER — Ambulatory Visit
Admission: RE | Admit: 2011-06-19 | Discharge: 2011-06-19 | Disposition: A | Discharge status: Home / Self Care | Payer: Medicare Other | Source: Ambulatory Visit | Attending: Family Medicine | Admitting: Family Medicine

## 2011-06-19 ENCOUNTER — Other Ambulatory Visit: Payer: Self-pay | Admitting: Family Medicine

## 2011-06-19 ENCOUNTER — Other Ambulatory Visit: Payer: Self-pay | Admitting: Diagnostic Radiology

## 2011-06-19 DIAGNOSIS — N63 Unspecified lump in unspecified breast: Secondary | ICD-10-CM

## 2011-06-22 ENCOUNTER — Inpatient Hospital Stay (HOSPITAL_COMMUNITY)
Admission: AD | Admit: 2011-06-22 | Discharge: 2011-06-23 | DRG: 287 | Disposition: A | Discharge status: Home / Self Care | Payer: Medicare Other | Source: Other Acute Inpatient Hospital | Attending: Cardiology | Admitting: Cardiology

## 2011-06-22 DIAGNOSIS — I701 Atherosclerosis of renal artery: Secondary | ICD-10-CM | POA: Diagnosis present

## 2011-06-22 DIAGNOSIS — N183 Chronic kidney disease, stage 3 unspecified: Secondary | ICD-10-CM | POA: Diagnosis present

## 2011-06-22 DIAGNOSIS — G4733 Obstructive sleep apnea (adult) (pediatric): Secondary | ICD-10-CM | POA: Diagnosis present

## 2011-06-22 DIAGNOSIS — I129 Hypertensive chronic kidney disease with stage 1 through stage 4 chronic kidney disease, or unspecified chronic kidney disease: Secondary | ICD-10-CM | POA: Diagnosis present

## 2011-06-22 DIAGNOSIS — Z7982 Long term (current) use of aspirin: Secondary | ICD-10-CM

## 2011-06-22 DIAGNOSIS — E119 Type 2 diabetes mellitus without complications: Secondary | ICD-10-CM | POA: Diagnosis present

## 2011-06-22 DIAGNOSIS — Z79899 Other long term (current) drug therapy: Secondary | ICD-10-CM

## 2011-06-22 DIAGNOSIS — E669 Obesity, unspecified: Secondary | ICD-10-CM | POA: Diagnosis present

## 2011-06-22 DIAGNOSIS — M109 Gout, unspecified: Secondary | ICD-10-CM | POA: Diagnosis present

## 2011-06-22 DIAGNOSIS — E039 Hypothyroidism, unspecified: Secondary | ICD-10-CM | POA: Diagnosis present

## 2011-06-22 DIAGNOSIS — I2510 Atherosclerotic heart disease of native coronary artery without angina pectoris: Secondary | ICD-10-CM | POA: Diagnosis present

## 2011-06-22 DIAGNOSIS — M1990 Unspecified osteoarthritis, unspecified site: Secondary | ICD-10-CM | POA: Diagnosis present

## 2011-06-22 DIAGNOSIS — R0789 Other chest pain: Principal | ICD-10-CM | POA: Diagnosis present

## 2011-06-22 DIAGNOSIS — Z96659 Presence of unspecified artificial knee joint: Secondary | ICD-10-CM

## 2011-06-22 DIAGNOSIS — E785 Hyperlipidemia, unspecified: Secondary | ICD-10-CM | POA: Diagnosis present

## 2011-06-22 DIAGNOSIS — K219 Gastro-esophageal reflux disease without esophagitis: Secondary | ICD-10-CM | POA: Diagnosis present

## 2011-06-22 DIAGNOSIS — R079 Chest pain, unspecified: Secondary | ICD-10-CM

## 2011-06-22 LAB — CARDIAC PANEL(CRET KIN+CKTOT+MB+TROPI)
CK, MB: 2.1 ng/mL (ref 0.3–4.0)
Total CK: 53 U/L (ref 7–177)
Troponin I: <0.3 ng/mL (ref <0.30)

## 2011-06-22 LAB — PROTIME-INR
INR: 1.05 (ref 0.00–1.49)
Prothrombin Time: 13.9 seconds (ref 11.6–15.2)

## 2011-06-22 LAB — CBC
Hemoglobin: 11.4 g/dL — ABNORMAL LOW (ref 12.0–15.0)
MCH: 29.6 pg (ref 26.0–34.0)
RBC: 3.85 MIL/uL — ABNORMAL LOW (ref 3.87–5.11)

## 2011-06-22 LAB — GLUCOSE, CAPILLARY: Glucose-Capillary: 96 mg/dL (ref 70–99)

## 2011-06-22 LAB — BASIC METABOLIC PANEL
GFR calc Af Amer: >60 mL/min (ref >60)
GFR calc non Af Amer: 60 mL/min — ABNORMAL LOW (ref >60)
Potassium: 4.3 mEq/L (ref 3.5–5.1)
Sodium: 143 mEq/L (ref 135–145)

## 2011-06-23 DIAGNOSIS — I2510 Atherosclerotic heart disease of native coronary artery without angina pectoris: Secondary | ICD-10-CM

## 2011-06-23 HISTORY — PX: CARDIAC CATHETERIZATION: SHX172

## 2011-06-23 LAB — LIPID PANEL
Cholesterol: 157 mg/dL (ref 0–200)
HDL: 46 mg/dL (ref >39)
LDL Cholesterol: 84 mg/dL (ref 0–99)
Triglycerides: 136 mg/dL (ref <150)

## 2011-06-23 LAB — CBC
HCT: 32.4 % — ABNORMAL LOW (ref 36.0–46.0)
Hemoglobin: 10.6 g/dL — ABNORMAL LOW (ref 12.0–15.0)
WBC: 8.2 10*3/uL (ref 4.0–10.5)

## 2011-06-23 LAB — HEMOGLOBIN A1C: Mean Plasma Glucose: 140 mg/dL — ABNORMAL HIGH (ref <117)

## 2011-06-23 LAB — BASIC METABOLIC PANEL
CO2: 29 mEq/L (ref 19–32)
GFR calc non Af Amer: >60 mL/min (ref >60)
Glucose, Bld: 122 mg/dL — ABNORMAL HIGH (ref 70–99)
Potassium: 3.4 mEq/L — ABNORMAL LOW (ref 3.5–5.1)
Sodium: 140 mEq/L (ref 135–145)

## 2011-06-23 LAB — TSH: TSH: 1.402 u[IU]/mL (ref 0.350–4.500)

## 2011-06-29 ENCOUNTER — Encounter: Payer: Self-pay | Admitting: *Deleted

## 2011-07-05 ENCOUNTER — Encounter: Payer: Self-pay | Admitting: *Deleted

## 2011-07-13 LAB — HEPARIN LEVEL (UNFRACTIONATED)
Heparin Unfractionated: 0.56
Heparin Unfractionated: <0.1 — ABNORMAL LOW

## 2011-07-13 LAB — BASIC METABOLIC PANEL
CO2: 27
Calcium: 9.3
Chloride: 105
GFR calc Af Amer: >60
Sodium: 138

## 2011-07-13 LAB — CBC
HCT: 33.1 — ABNORMAL LOW
Hemoglobin: 11.6 — ABNORMAL LOW
MCHC: 33.9
MCV: 87.7
RBC: 3.78 — ABNORMAL LOW
RBC: 3.92
WBC: 5.6
WBC: 6.1

## 2011-07-13 LAB — PROTIME-INR: INR: 1.1

## 2011-07-13 NOTE — Cardiovascular Report (Signed)
  NAMESCOTLYNN, NOYES NO.:  [id number]  MEDICAL RECORD NO.:  [id number]  LOCATION:  3705                         FACILITY:  [facility]  PHYSICIAN:  Rollene Rotunda, MD, FACCDATE OF BIRTH:  [birth year]  DATE OF PROCEDURE:  06/23/2011 DATE OF DISCHARGE:                           CARDIAC CATHETERIZATION   PRIMARY:  Carmin Richmond, MD, in [place].  PROCEDURE:  Left heart catheterization/coronary arteriography.  INDICATIONS:  Evaluate the patient with chest pain and stress perfusion study earlier this year that suggested possible anterior ischemia.  PROCEDURE NOTE:  Left heart catheterization was performed via the right radial artery.  The artery was cannulated using an anterior wall puncture.  A #5-French arterial sheath was inserted via the modified Seldinger technique.  Preformed Judkins and pigtail catheter were utilized.  The patient tolerated this procedure well and left the lab in stable condition.  RESULTS:  Hemodynamics:  LV 175/16, AO 164/67.  Coronaries:  The left main was normal.  The LAD had ostial 50-60% stenosis.  Vessel wrapped the apex and was otherwise free of disease. There were two small diagonals which were normal.  The circumflex in the AV groove was normal.  There was a moderate-sized first obtuse marginal which was normal.  There was a small to moderate sized second obtuse marginal which was normal.  Right coronary artery:  The right coronary artery was large and dominant.  PDA was moderate sized and normal.  LEFT VENTRICULOGRAM:  Left ventriculogram was somewhat difficult to assess secondary to ventricular ectopy, however, there did not appear to be wall motion abnormalities.  The EF was likely to be 65%.  CONCLUSION:  Moderate LAD stenosis.  Normal left ventricular function.  PLAN:  I did not suspect that the ostial LAD lesion is hemodynamically significant.  The previous perfusion study was likely breast attenuation.  Her symptoms  are atypical.  At this point, I do plan an outpatient dobutamine echocardiogram to further evaluate the LAD.  She will be otherwise managed medically.     Rollene Rotunda, MD, [facility]     JH/MEDQ  D:  06/23/2011  T:  06/23/2011  Job:  [id number]  cc:   Carmin Richmond, MD  Electronically Signed by Rollene Rotunda MD [facility] on 07/13/2011 01:35:56 PM

## 2011-07-13 NOTE — H&P (Signed)
[name], [name] NO.:  [id number]  MEDICAL RECORD NO.:  [id number]  LOCATION:  3705                         FACILITY:  [facility]  PHYSICIAN:  Rollene Rotunda, MD, FACCDATE OF BIRTH:  1931/12/16  DATE OF ADMISSION:  06/22/2011 DATE OF DISCHARGE:                             HISTORY & PHYSICAL   PRIMARY CARE PHYSICIAN:  Carmin Richmond, MD, phone number [phone].  CARDIOLOGIST IN [place]:  Dr. Julio Alm.  CARDIOLOGIST IN [place]:  Rollene Rotunda, MD, [facility]  CHIEF COMPLAINT:  Chest pain.  HISTORY OF PRESENT ILLNESS:  [name] is a 77 year old female with a history of nonobstructive coronary artery disease by remote cath.  She had chest pain in March 2012, and at that time, had an exercise Myoview that showed possible ischemia with a normal ejection fraction.  Her physician told her that if she had any more chest pain, she would be cathed.  She has had some episodes of chest pain that were either first thing in the morning prior to getting out of bed or when lying down at night. She states that during the day when she is up and moving around, her symptoms disappear.  However, at 3:30 this morning, she was wakened by upper left chest pressure that radiated up into her left shoulder.  It was an 8/10.  There were no associated symptoms.  It seem to be a bit better when she was lying on her left side.  It was similar to her previous episodes.  She took a full-strength aspirin and took a sublingual nitroglycerin.  The sublingual nitroglycerin reduced her pain from an 8/10 to a 4/10.  When her symptoms did not resolve, she went to the [facility] Emergency Room.  In the [facility] Emergency Room, she received sublingual nitroglycerin as well as nitroglycerin paste and O2.  Her symptoms resolved.  She was transferred to [facility] for further evaluation and possible catheterization.  After arrival at [facility], she had recurrent chest pain that was not as severe.  This resolved  with sublingual nitroglycerin x1 and increased O2.  Currently, she is pain free.  PAST MEDICAL HISTORY: 1. Status post cardiac catheterization on June 17, 2011, showing     minimal coronary artery disease of less than or equal to 25%, right     renal artery 25% stenosed and minimal aortic plaque. 2. Status post echocardiogram in September 2008 showing an EF of 60%. 3. Status post Myoview in March 2012 showing mildly severe, mild-to-     moderately-sized basal anterior and mid anterior nearly completely     reversible defect consistent with probable ischemia.  This was an     exercise Myoview and there was significant ST depression at peak     exercise, which resolved in recovery.  She had a normal ejection     fraction and no attenuation issues.  This represents a moderate     risk nuclear stress test. 4. Hypertension. 5. Hyperlipidemia. 6. Diabetes. 7. Obesity. 8. History of hypothyroidism. 9. Gastroesophageal reflux disease. 10.Gout. 11.Chronic kidney disease, stage III. 12.Degenerative joint disease. 13.History of obstructive sleep apnea, on continuous positive airway     pressure.  SURGICAL HISTORY:  She is status post back surgery as well as cardiac catheterization, recent breast biopsy, remote bilateral total knee replacements, and cholecystectomy.  ALLERGIES:  No known drug allergies.  CURRENT MEDICATIONS:  Per the list from [facility] are; 1. Aspirin 325 mg a day. 2. Sublingual nitroglycerin p.r.n. 3. Lisinopril 40 mg nightly. 4. Losartan 100 mg a day. 5. Coreg, dose unknown b.i.d. 6. Metformin 500 mg one half tablet b.i.d. 7. Levoxyl 25 mcg daily. 8. Gout medicines for x2.  SOCIAL HISTORY:  She lives in [place] with her husband.  She is retired.  She has no history of alcohol, tobacco, or drug abuse.  FAMILY HISTORY:  Her mother died at age 51 with a history of coronary artery disease, not premature.  Her father and siblings have no  known coronary artery disease.  REVIEW OF SYSTEMS:  She has chronic dyspnea on exertion that is not changed significantly recently.  She has occasional daytime edema.  She has not had any recent illnesses, fevers, or chills, and there has been no coughing or wheezing.  She has chronic arthralgias and joint swelling secondary to osteoarthritis.  She denies reflux symptoms or melena. Full 14-point review of systems is otherwise negative except as stated in the HPI.  PHYSICAL EXAMINATION:  VITAL SIGNS:  Temperature is 98.5, blood pressure 125/70, heart rate 54, respiratory rate 20, O2 saturation 97% on 3 liters. GENERAL:  She is a well-developed and well-nourished white female in no acute distress. HEENT:  Normal. NECK:  There is no lymphadenopathy, thyromegaly, bruit, or JVD noted. CV:  Her heart is regular in rate and rhythm with an S1-S2 and no significant murmur, rub, or gallop is noted.  Distal pulses are intact in all four extremities. LUNGS:  Clear to auscultation bilaterally. SKIN:  No rashes or lesions are noted. ABDOMEN:  Soft and nontender with active bowel sounds. EXTREMITIES:  There is no cyanosis, clubbing, and only trace edema. MUSCULOSKELETAL:  There is no joint deformity or effusions and no spine or CVA tenderness. NEUROLOGIC:  She is alert and oriented with cranial nerves II through XII grossly intact.  Chest x-ray per report from [facility], no acute disease.  EKG; sinus bradycardia, no acute ischemic changes.  Laboratory values from [facility]; hemoglobin 12.8, hematocrit 38.1, WBCs 5.6, platelets 186.  Sodium 141, potassium 3.8, chloride 104, CO2 32, BUN 19, creatinine 1.2, glucose 113.  CK-MB and troponin-I negative x1.  INR 0.93.  IMPRESSION:  [name] was seen today by Dr. Antoine Poche, the patient evaluated and the data reviewed.  She is a 77 year old female with a history of nonobstructive coronary artery disease by remote catheterization, who  has had recurrent chest pain in an abnormal stress test.  Cardiac catheterization is indicated.  The risks and benefits of the procedure were discussed by Dr. Antoine Poche with the patient and her family.  They agree to proceed.  We will do this tomorrow.  We will screen for cardiac risk factors.     Theodore Demark, PA-C   ______________________________ Rollene Rotunda, MD, [facility]    RB/MEDQ  D:  06/22/2011  T:  06/22/2011  Job:  [id number]  Electronically Signed by Theodore Demark PA-C on 07/13/2011 06:40:07 AM Electronically Signed by Rollene Rotunda MD [facility] on 07/13/2011 01:36:00 PM

## 2011-07-13 NOTE — Discharge Summary (Signed)
NAMEMALAYIA, Casey NO.:  [id number]  MEDICAL RECORD NO.:  [id number]  LOCATION:  3705                         FACILITY:  [facility]  PHYSICIAN:  Christine Rotunda, MD, FACCDATE OF BIRTH:  January 16, 1932  DATE OF ADMISSION:  06/22/2011 DATE OF DISCHARGE:  06/23/2011                              DISCHARGE SUMMARY   PRIMARY CARDIOLOGIST:  Christine Rotunda, MD, [facility]  PRIMARY CARE PHYSICIAN:  Christine Richmond, MD  DISCHARGE DIAGNOSIS:  Chest pain without objective evidence of ischemia.  SECONDARY DIAGNOSES: 1. Coronary artery disease status post catheterization this admission     revealing nonobstructive coronary artery disease including a 50-60%     left anterior descending stenosis. 2. Hypertension. 3. Hyperlipidemia. 4. Diabetes mellitus. 5. Obesity. 6. Hypothyroidism. 7. Gastroesophageal reflux disease. 8. Gout. 9. Chronic kidney disease. 10.Degenerative joint disease. 11.Obstructive sleep apnea, on CPAP.  ALLERGIES:  No known drug allergies.  HISTORY OF PRESENT ILLNESS:  This is a 77 year old female with prior history of nonobstructive CAD who was in usual state of health until the morning of June 22, 2011 when she awoke about 3:30 a.m. with substernal chest pressure and subsequently presented to [facility].  There she underwent an exercise Myoview revealing mild to moderately sided basal anterior and mid anterior reversible defect consistent with ischemia.  The patient also had significant ST depression with peak exercise.  The decision was made to transfer to [facility] for further evaluation and catheterization.  HOSPITAL COURSE:  The patient had no further chest discomfort upon arrival to [facility].  Of note, cardiac enzymes were negative at [facility].  Decision was made to pursue diagnostic catheterization which took place this morning revealing nonobstructive disease including a 50- 60% ostial LAD stenosis.  EF was 65%.  It was felt that  the patient's nonobstructive was unlikely to be causing symptoms and plan to discharge her home today in good condition.  She will follow up in the office and we will plan on a dobutamine echocardiogram to further evaluate for wall motion abnormalities related to her LAD stenosis.  Follow up with Dr. Antoine Poche in a few weeks.  She will be discharged home today in good condition.  DISCHARGE LABORATORY DATA:  Hemoglobin 10.6, hematocrit 33.4, WBC 8.2, and platelets 179.  Sodium 140, potassium 3.4, chloride 104, CO2 29, BUN 16, creatinine 0.86, glucose 122, and calcium 8.8.  Hemoglobin A1c 6.5. CK 62, MB 2.0, and troponin-I less than 0.30.  Total cholesterol 137, triglycerides 136, HDL 46, and LDL 84.  TSH 1.402.  DISPOSITION:  The patient will be discharged home today in good condition.  FOLLOWUP PLANS AND APPOINTMENTS:  We will arrange for a followup with Dr. Antoine Poche as well as a dobutamine echo in our office.  Follow up with primary care provider as scheduled.  DISCHARGE MEDICATIONS: 1. Nitroglycerin 0.4 mg sublingual p.r.n. chest pain. 2. Aspirin 81 mg daily. 3. Carvedilol 25 mg 1/2 tablet b.i.d. 4. Multivitamin 1 tablet daily. 5. Equate Arthritis 1 tablet q.i.d. p.r.n. 6. Glucosamine 1 tablet daily. 7. Levothyroxine 25 mcg daily. 8. Lisinopril 40 mg daily. 9. Losartan 100 mg daily. 10.Metformin 500 mg 1/2 tablet b.i.d. to resume June 25, 2011. 11.Systane 1 drop both eyes as needed. 12.Torsemide 20 mg 2 tablets daily. 13.Uloric 40 mg daily.  OUTSTANDING LABORATORY STUDIES:  Dr. Antoine Poche will arrange for dobutamine echo.  DURATION OF DISCHARGE ENCOUNTER:  40 minutes including physician time.     Nicolasa Ducking, ANP   ______________________________ Christine Rotunda, MD, [facility]    CB/MEDQ  D:  06/23/2011  T:  06/23/2011  Job:  [id number]  cc:   Christine Richmond, MD Joneen Roach, M.D.  Electronically Signed by Nicolasa Ducking ANP on 06/27/2011 04:20:39  PM Electronically Signed by Christine Rotunda MD [facility] on 07/13/2011 01:35:53 PM

## 2011-07-14 LAB — LIPID PANEL
LDL Cholesterol: 72
Total CHOL/HDL Ratio: 3.6
VLDL: 20

## 2011-07-14 LAB — CBC
HCT: 32.3 — ABNORMAL LOW
HCT: 34.1 — ABNORMAL LOW
HCT: 34.2 — ABNORMAL LOW
Hemoglobin: 11.4 — ABNORMAL LOW
Hemoglobin: 11.5 — ABNORMAL LOW
MCHC: 33.4
MCHC: 33.8
MCHC: 33.9
MCV: 86.7
MCV: 87.3
Platelets: 152
RBC: 3.94
RDW: 14.6 — ABNORMAL HIGH
RDW: 14.7 — ABNORMAL HIGH
RDW: 15.1 — ABNORMAL HIGH

## 2011-07-14 LAB — BASIC METABOLIC PANEL
BUN: 20
CO2: 31
Calcium: 9
Chloride: 104
GFR calc non Af Amer: 50 — ABNORMAL LOW
Glucose, Bld: 108 — ABNORMAL HIGH
Potassium: 3.6
Potassium: 3.8
Sodium: 137
Sodium: 139

## 2011-07-14 LAB — COMPREHENSIVE METABOLIC PANEL
AST: 30
Albumin: 3.3 — ABNORMAL LOW
Alkaline Phosphatase: 50
BUN: 24 — ABNORMAL HIGH
CO2: 29
Calcium: 8.6
Creatinine, Ser: 1.22 — ABNORMAL HIGH
Glucose, Bld: 97
Total Bilirubin: 0.5
Total Protein: 6.2

## 2011-07-14 LAB — CARDIAC PANEL(CRET KIN+CKTOT+MB+TROPI)
CK, MB: 0.7
CK, MB: 0.9
Relative Index: INVALID
Relative Index: INVALID
Total CK: 53
Total CK: 71
Troponin I: 0.02

## 2011-07-14 LAB — HEPARIN LEVEL (UNFRACTIONATED): Heparin Unfractionated: 0.49

## 2011-07-19 ENCOUNTER — Other Ambulatory Visit (HOSPITAL_COMMUNITY): Payer: Medicare Other | Admitting: Radiology

## 2011-07-19 ENCOUNTER — Encounter: Payer: Self-pay | Admitting: Cardiology

## 2011-07-19 ENCOUNTER — Other Ambulatory Visit (HOSPITAL_COMMUNITY): Payer: Self-pay | Admitting: Cardiology

## 2011-07-19 ENCOUNTER — Other Ambulatory Visit (HOSPITAL_COMMUNITY): Payer: Medicare Other

## 2011-07-19 ENCOUNTER — Encounter: Payer: Self-pay | Admitting: *Deleted

## 2011-07-20 ENCOUNTER — Ambulatory Visit (INDEPENDENT_AMBULATORY_CARE_PROVIDER_SITE_OTHER): Payer: Medicare Other | Admitting: Cardiology

## 2011-07-20 ENCOUNTER — Ambulatory Visit (HOSPITAL_BASED_OUTPATIENT_CLINIC_OR_DEPARTMENT_OTHER): Payer: Medicare Other

## 2011-07-20 ENCOUNTER — Ambulatory Visit (HOSPITAL_COMMUNITY): Payer: Medicare Other | Attending: Cardiology | Admitting: Radiology

## 2011-07-20 ENCOUNTER — Encounter: Payer: Self-pay | Admitting: Cardiology

## 2011-07-20 DIAGNOSIS — I10 Essential (primary) hypertension: Secondary | ICD-10-CM

## 2011-07-20 DIAGNOSIS — E669 Obesity, unspecified: Secondary | ICD-10-CM | POA: Insufficient documentation

## 2011-07-20 DIAGNOSIS — R9439 Abnormal result of other cardiovascular function study: Secondary | ICD-10-CM | POA: Insufficient documentation

## 2011-07-20 DIAGNOSIS — E119 Type 2 diabetes mellitus without complications: Secondary | ICD-10-CM | POA: Insufficient documentation

## 2011-07-20 DIAGNOSIS — R072 Precordial pain: Secondary | ICD-10-CM | POA: Insufficient documentation

## 2011-07-20 DIAGNOSIS — E785 Hyperlipidemia, unspecified: Secondary | ICD-10-CM

## 2011-07-20 DIAGNOSIS — I2510 Atherosclerotic heart disease of native coronary artery without angina pectoris: Secondary | ICD-10-CM

## 2011-07-20 DIAGNOSIS — R0989 Other specified symptoms and signs involving the circulatory and respiratory systems: Secondary | ICD-10-CM

## 2011-07-20 MED ORDER — SODIUM CHLORIDE 0.9 % IV SOLN
40.0000 ug/kg | Freq: Once | INTRAVENOUS | Status: AC
  Administered 2011-07-20: 40 ug/kg/min via INTRAVENOUS

## 2011-07-20 MED ORDER — ATROPINE SULFATE 0.1 MG/ML IJ SOLN
1.0000 mg | Freq: Once | INTRAMUSCULAR | Status: AC
  Administered 2011-07-20: 0.5 mg via INTRAVENOUS

## 2011-07-20 MED ORDER — PERFLUTREN PROTEIN A MICROSPH IV SUSP
3.0000 mL | Freq: Once | INTRAVENOUS | Status: AC
  Administered 2011-07-20: 3 mL via INTRAVENOUS

## 2011-07-20 NOTE — Patient Instructions (Signed)
Follow up in 6 months with Dr Hochrein.  You will receive a letter in the mail 2 months before you are due.  Please call us when you receive this letter to schedule your follow up appointment.   The current medical regimen is effective;  continue present plan and medications.  

## 2011-07-20 NOTE — Assessment & Plan Note (Signed)
Her LDL in the hospital was 84 with an HDL of 46. She will remain on meds as listed.

## 2011-07-20 NOTE — Assessment & Plan Note (Signed)
Her hemoglobin A1c in the hospital was 6.5. She will remain on meds as listed and I will defer to her primary physician.

## 2011-07-20 NOTE — Assessment & Plan Note (Signed)
Her blood pressure is labile.  She will keep a BP diary.  At this point I will not change her medications.

## 2011-07-20 NOTE — Progress Notes (Signed)
HPI The patient presents for followup after recent cardiac catheter. He chest discomfort. This was at an outside hospital. She had a stress perfusion study which suggested an anterior reversible defect. However, cardiac catheterization in our hospital demonstrated only a 50 - 60% ostial lesion.  This appeared not to be hemodynamically significant. Her chest pain was sharp and fleeting and somewhat atypical. It continued for about a week after discharge but has since stopped. She has now been walking 30 minutes daily. She has not been getting any chest pressure, neck or arm discomfort. She has not been having any palpitations, presyncope or syncope. Of note she does report that her blood pressure has been quite labile.  She had a dobutamine echo today which did not suggest any ischemia.  No Known Allergies  Current Outpatient Prescriptions  Medication Sig Dispense Refill  . Acetaminophen (ARTHRITIS PAIN RELIEF PO) Take 1 tablet by mouth 4 (four) times daily as needed.        Marland Kitchen aspirin 81 MG chewable tablet Chew 81 mg by mouth daily.        . carvedilol (COREG) 25 MG tablet Take 25 mg by mouth 2 (two) times daily with a meal.       . diclofenac sodium (VOLTAREN) 1 % GEL Apply 1 application topically daily.        . febuxostat (ULORIC) 40 MG tablet Take 80 mg by mouth daily.        Marland Kitchen GLUCOSAMINE PO Take by mouth daily.        Marland Kitchen levothyroxine (SYNTHROID, LEVOTHROID) 25 MCG tablet Take 25 mcg by mouth daily.        Marland Kitchen lisinopril (PRINIVIL,ZESTRIL) 40 MG tablet Take 40 mg by mouth daily.        Marland Kitchen losartan (COZAAR) 100 MG tablet Take 100 mg by mouth daily.        . metFORMIN (GLUCOPHAGE) 500 MG tablet Take 250 mg by mouth 2 (two) times daily with a meal.        . multivitamin (THERAGRAN) per tablet Take 1 tablet by mouth daily.        . nitroGLYCERIN (NITROSTAT) 0.4 MG SL tablet Place under the tongue every 5 (five) minutes as needed.        Bertram Gala Glycol-Propyl Glycol (SYSTANE OP) Place 1 drop  into both eyes as needed.        . torsemide (DEMADEX) 20 MG tablet Take 40 mg by mouth daily.        No current facility-administered medications for this visit.   Facility-Administered Medications Ordered in Other Visits  Medication Dose Route Frequency Provider Last Rate Last Dose  . atropine 0.1 MG/ML injection 1 mg  1 mg Intravenous Once Rollene Rotunda, MD   0.5 mg at 07/20/11 1615  . DOBUTamine (DOBUTREX) 1,000 mcg/mL in sodium chloride 0.9 % 150 mL infusion  40 mcg/kg Intravenous Once Rollene Rotunda, MD   40 mcg/kg/min at 07/20/11 1600  . perflutren protein A microspheres (OPTISON) injection 3 mL  3 mL Intravenous Once Rollene Rotunda, MD   3 mL at 07/20/11 1615    Past Medical History  Diagnosis Date  . CAD (coronary artery disease)   . HTN (hypertension)   . HLD (hyperlipidemia)   . DM (diabetes mellitus)   . Obesity   . GERD (gastroesophageal reflux disease)   . Gout   . DJD (degenerative joint disease)   . OSA on CPAP   . Dyspnea     chronic  Past Surgical History  Procedure Date  . Cardiac catheterization 06-23-2011    left heart  . Total knee arthroplasty     bilateral  . Cholecystectomy   . Back surgery     ROS:  Neck pain.  Otherwise as stated in the HPI and negative for all other systems.  PHYSICAL EXAM BP 112/64  Pulse 94  Ht 5\' 2"  (1.575 m)  Wt 210 lb (95.255 kg)  BMI 38.41 kg/m2 GENERAL:  Well appearing HEENT:  Pupils equal round and reactive, fundi not visualized, oral mucosa unremarkable NECK:  No jugular venous distention, waveform within normal limits, carotid upstroke brisk and symmetric, no bruits, no thyromegaly LYMPHATICS:  No cervical, inguinal adenopathy LUNGS:  Clear to auscultation bilaterally BACK:  No CVA tenderness CHEST:  Unremarkable HEART:  PMI not displaced or sustained,S1 and S2 within normal limits, no S3, no S4, no clicks, no rubs, no murmurs ABD:  Flat, positive bowel sounds normal in frequency in pitch, no bruits, no  rebound, no guarding, no midline pulsatile mass, no hepatomegaly, no splenomegaly EXT:  2 plus pulses throughout, no edema, no cyanosis no clubbing SKIN:  No rashes no nodules NEURO:  Cranial nerves II through XII grossly intact, motor grossly intact throughout PSYCH:  Cognitively intact, oriented to person place and time  ASSESSMENT AND PLAN

## 2011-07-20 NOTE — Assessment & Plan Note (Signed)
Given the results of the dobutamine echo no further testing is indicated. She needs aggressive risk reduction.

## 2011-12-13 ENCOUNTER — Other Ambulatory Visit: Payer: Self-pay | Admitting: Neurological Surgery

## 2011-12-13 DIAGNOSIS — M47816 Spondylosis without myelopathy or radiculopathy, lumbar region: Secondary | ICD-10-CM

## 2011-12-21 ENCOUNTER — Ambulatory Visit
Admission: RE | Admit: 2011-12-21 | Discharge: 2011-12-21 | Disposition: A | Discharge status: Home / Self Care | Payer: Medicare Other | Source: Ambulatory Visit | Attending: Neurological Surgery | Admitting: Neurological Surgery

## 2011-12-21 DIAGNOSIS — M47816 Spondylosis without myelopathy or radiculopathy, lumbar region: Secondary | ICD-10-CM

## 2011-12-21 MED ORDER — GADOBENATE DIMEGLUMINE 529 MG/ML IV SOLN
19.0000 mL | Freq: Once | INTRAVENOUS | Status: AC | PRN
  Administered 2011-12-21: 19 mL via INTRAVENOUS

## 2013-04-06 ENCOUNTER — Observation Stay (HOSPITAL_COMMUNITY)
Admission: AD | Admit: 2013-04-06 | Discharge: 2013-04-08 | Disposition: A | Discharge status: Home / Self Care | Payer: Medicare Other | Source: Other Acute Inpatient Hospital | Attending: Cardiology | Admitting: Cardiology

## 2013-04-06 ENCOUNTER — Encounter (HOSPITAL_COMMUNITY): Payer: Self-pay | Admitting: *Deleted

## 2013-04-06 ENCOUNTER — Observation Stay (HOSPITAL_COMMUNITY): Payer: Medicare Other

## 2013-04-06 DIAGNOSIS — R079 Chest pain, unspecified: Secondary | ICD-10-CM

## 2013-04-06 DIAGNOSIS — Z79899 Other long term (current) drug therapy: Secondary | ICD-10-CM | POA: Insufficient documentation

## 2013-04-06 DIAGNOSIS — I10 Essential (primary) hypertension: Secondary | ICD-10-CM | POA: Insufficient documentation

## 2013-04-06 DIAGNOSIS — E119 Type 2 diabetes mellitus without complications: Secondary | ICD-10-CM | POA: Insufficient documentation

## 2013-04-06 DIAGNOSIS — R072 Precordial pain: Principal | ICD-10-CM | POA: Insufficient documentation

## 2013-04-06 DIAGNOSIS — I2510 Atherosclerotic heart disease of native coronary artery without angina pectoris: Secondary | ICD-10-CM

## 2013-04-06 DIAGNOSIS — E785 Hyperlipidemia, unspecified: Secondary | ICD-10-CM | POA: Insufficient documentation

## 2013-04-06 DIAGNOSIS — R0789 Other chest pain: Secondary | ICD-10-CM

## 2013-04-06 LAB — GLUCOSE, CAPILLARY
Glucose-Capillary: 113 mg/dL — ABNORMAL HIGH (ref 70–99)
Glucose-Capillary: 105 mg/dL — ABNORMAL HIGH (ref 70–99)

## 2013-04-06 LAB — MRSA PCR SCREENING: MRSA by PCR: NEGATIVE

## 2013-04-06 MED ORDER — SODIUM CHLORIDE 0.9 % IJ SOLN
3.0000 mL | Freq: Two times a day (BID) | INTRAMUSCULAR | Status: DC
  Administered 2013-04-06 – 2013-04-07 (×2): 3 mL via INTRAVENOUS

## 2013-04-06 MED ORDER — ACETAMINOPHEN 325 MG PO TABS: 650.0000 mg | ORAL_TABLET | ORAL | Status: DC | PRN

## 2013-04-06 MED ORDER — LISINOPRIL 40 MG PO TABS
40.0000 mg | ORAL_TABLET | Freq: Every day | ORAL | Status: DC
  Administered 2013-04-07 – 2013-04-08 (×2): 40 mg via ORAL
  Filled 2013-04-06 (×3): qty 1

## 2013-04-06 MED ORDER — ACETAMINOPHEN 325 MG PO TABS: 650.0000 mg | ORAL_TABLET | Freq: Three times a day (TID) | ORAL | Status: DC | PRN

## 2013-04-06 MED ORDER — TORSEMIDE 20 MG PO TABS
40.0000 mg | ORAL_TABLET | Freq: Every day | ORAL | Status: DC
  Administered 2013-04-06: 40 mg via ORAL
  Filled 2013-04-06 (×3): qty 2

## 2013-04-06 MED ORDER — SODIUM CHLORIDE 0.9 % IJ SOLN: 3.0000 mL | INTRAMUSCULAR | Status: DC | PRN

## 2013-04-06 MED ORDER — ONDANSETRON HCL 4 MG/2ML IJ SOLN: 4.0000 mg | Freq: Four times a day (QID) | INTRAMUSCULAR | Status: DC | PRN

## 2013-04-06 MED ORDER — CARVEDILOL 25 MG PO TABS
25.0000 mg | ORAL_TABLET | Freq: Two times a day (BID) | ORAL | Status: DC
  Administered 2013-04-08: 09:00:00 25 mg via ORAL
  Filled 2013-04-06 (×6): qty 1

## 2013-04-06 MED ORDER — SODIUM CHLORIDE 0.9 % IV SOLN: 250.0000 mL | INTRAVENOUS | Status: DC | PRN

## 2013-04-06 MED ORDER — SODIUM CHLORIDE 0.9 % IJ SOLN
3.0000 mL | Freq: Two times a day (BID) | INTRAMUSCULAR | Status: DC
  Administered 2013-04-07 (×2): 3 mL via INTRAVENOUS

## 2013-04-06 MED ORDER — LEVOTHYROXINE SODIUM 25 MCG PO TABS
25.0000 ug | ORAL_TABLET | Freq: Every day | ORAL | Status: DC
  Administered 2013-04-07 – 2013-04-08 (×2): 25 ug via ORAL
  Filled 2013-04-06 (×3): qty 1

## 2013-04-06 MED ORDER — PRAMOXINE-ZINC OXIDE IN MO 1-12.5 % RE OINT
1.0000 "application " | TOPICAL_OINTMENT | Freq: Three times a day (TID) | RECTAL | Status: DC | PRN
  Filled 2013-04-06: qty 28.3

## 2013-04-06 MED ORDER — ALUM & MAG HYDROXIDE-SIMETH 200-200-20 MG/5ML PO SUSP: 30.0000 mL | ORAL | Status: DC | PRN

## 2013-04-06 MED ORDER — ASPIRIN 81 MG PO CHEW
81.0000 mg | CHEWABLE_TABLET | Freq: Every day | ORAL | Status: DC
Start: 2013-04-06 — End: 2013-04-08
  Administered 2013-04-07 – 2013-04-08 (×2): 81 mg via ORAL
  Filled 2013-04-06 (×2): qty 1

## 2013-04-06 MED ORDER — HEPARIN (PORCINE) IN NACL 100-0.45 UNIT/ML-% IJ SOLN
850.0000 [IU]/h | INTRAMUSCULAR | Status: DC
  Administered 2013-04-06: 850 [IU]/h via INTRAVENOUS
  Filled 2013-04-06 (×2): qty 250

## 2013-04-06 MED ORDER — GUAIFENESIN-DM 100-10 MG/5ML PO SYRP: 15.0000 mL | ORAL_SOLUTION | ORAL | Status: DC | PRN

## 2013-04-06 MED ORDER — TRAMADOL HCL 50 MG PO TABS: 50.0000 mg | ORAL_TABLET | Freq: Four times a day (QID) | ORAL | Status: DC | PRN

## 2013-04-06 MED ORDER — ACETAMINOPHEN ER 650 MG PO TBCR: 650.0000 mg | EXTENDED_RELEASE_TABLET | Freq: Three times a day (TID) | ORAL | Status: DC | PRN

## 2013-04-06 MED ORDER — NITROGLYCERIN 0.3 MG SL SUBL
0.3000 mg | SUBLINGUAL_TABLET | SUBLINGUAL | Status: DC | PRN
  Filled 2013-04-06: qty 100

## 2013-04-06 MED ORDER — MAGNESIUM HYDROXIDE 400 MG/5ML PO SUSP: 30.0000 mL | Freq: Every day | ORAL | Status: DC | PRN

## 2013-04-06 MED ORDER — FEBUXOSTAT 40 MG PO TABS
80.0000 mg | ORAL_TABLET | Freq: Every day | ORAL | Status: DC
  Administered 2013-04-07: 80 mg via ORAL
  Filled 2013-04-06 (×3): qty 2

## 2013-04-06 MED ORDER — LOPERAMIDE HCL 2 MG PO CAPS
2.0000 mg | ORAL_CAPSULE | ORAL | Status: DC | PRN
  Filled 2013-04-06: qty 1

## 2013-04-06 NOTE — H&P (Signed)
Physician History and Physical  Patient ID: JANIE CAPP MRN: [id number] DOB/AGE: Jul 13, 1932 79 y.o. Admit date: 04/06/2013  Primary Care Physician:  Earlene Plater in [place] Primary Cardiologist:  Antoine Poche  Active Problems:   Chest pain   HPI:  79 yo transferred from [facility] for chest pain.  Last seen by Dr Antoine Poche in 2012 Abnormal myovue  Cath with 50% ostial LAD disease not thought to be flow limiting  F/U stress echo normal.  CRF;s DM, HTN and elevated lipids compliant with meds Had a few episodes of SSCP relieved with nitro at home.  Recurred during night when trying to sleep.  Had left sided pain 2 weeks ago that has a pleuritic component that still bothers her from a fall.  No cough sputum dyspnea or hemoptysis  Went to [place] and had more episodes relieved with nitro.  R/O  Cr 1.2  Hct 35.5 PLT 195 K 3.7 normal coags.  ECG at [facility] rate 61 PR 222 otherwise normal.   CXR report:  CE prominent central pulmonary vascularity NAD  Review of systems complete and found to be negative unless listed above   Past Medical History  Diagnosis Date  . CAD (coronary artery disease)     Cath 06/23/11.  LAD 50 - 60% ostial, EF 65%  . HTN (hypertension)   . HLD (hyperlipidemia)   . DM (diabetes mellitus)   . Obesity   . GERD (gastroesophageal reflux disease)   . Gout   . DJD (degenerative joint disease)   . OSA on CPAP   . Dyspnea     chronic    Family History  Problem Relation Age of Onset  . Coronary artery disease Mother     History   Social History  . Marital Status: Married    Spouse Name: N/A    Number of Children: N/A  . Years of Education: N/A   Occupational History  . Not on file.   Social History Main Topics  . Smoking status: Never Smoker   . Smokeless tobacco: Never Used  . Alcohol Use: No  . Drug Use: No  . Sexually Active: Not on file   Other Topics Concern  . Not on file   Social History Narrative  . No narrative on file    Past Surgical History   Procedure Laterality Date  . Cardiac catheterization  06-23-2011    left heart  . Total knee arthroplasty      bilateral  . Cholecystectomy    . Back surgery       Prescriptions prior to admission  Medication Sig Dispense Refill  . Acetaminophen (ARTHRITIS PAIN RELIEF PO) Take 1 tablet by mouth 4 (four) times daily as needed.        Marland Kitchen aspirin 81 MG chewable tablet Chew 81 mg by mouth daily.        . carvedilol (COREG) 25 MG tablet Take 25 mg by mouth 2 (two) times daily with a meal.       . diclofenac sodium (VOLTAREN) 1 % GEL Apply 1 application topically daily.        . febuxostat (ULORIC) 40 MG tablet Take 80 mg by mouth daily.        Marland Kitchen GLUCOSAMINE PO Take by mouth daily.        Marland Kitchen levothyroxine (SYNTHROID, LEVOTHROID) 25 MCG tablet Take 25 mcg by mouth daily.        Marland Kitchen lisinopril (PRINIVIL,ZESTRIL) 40 MG tablet Take 40 mg by mouth  daily.        . losartan (COZAAR) 100 MG tablet Take 100 mg by mouth daily.        . metFORMIN (GLUCOPHAGE) 500 MG tablet Take 250 mg by mouth 2 (two) times daily with a meal.        . multivitamin (THERAGRAN) per tablet Take 1 tablet by mouth daily.        . nitroGLYCERIN (NITROSTAT) 0.4 MG SL tablet Place under the tongue every 5 (five) minutes as needed.        Bertram Gala Glycol-Propyl Glycol (SYSTANE OP) Place 1 drop into both eyes as needed.        . torsemide (DEMADEX) 20 MG tablet Take 40 mg by mouth daily.         Physical Exam: There were no vitals taken for this visit.    Affect appropriate Healthy:  appears stated age HEENT: normal Neck supple with no adenopathy JVP normal no bruits no thyromegaly Lungs clear with no wheezing and good diaphragmatic motion Heart:  S1/S2 no murmur, no rub, gallop or click PMI normal Abdomen: benighn, BS positve, no tenderness, no AAA no bruit.  No HSM or HJR Distal pulses intact with no bruits No edema Neuro non-focal Skin warm and dry No muscular weakness S/P bilateral knee replacements Mild pain  to palpation over left ribs  Labs:   Lab Results  Component Value Date   WBC 8.2 06/23/2011   HGB 10.6* 06/23/2011   HCT 32.4* 06/23/2011   MCV 89.0 06/23/2011   PLT 179 06/23/2011   No results found for this basename: NA, K, CL, CO2, BUN, CREATININE, CALCIUM, LABALBU, PROT, BILITOT, ALKPHOS, ALT, AST, GLUCOSE,  in the last 168 hours Lab Results  Component Value Date   CKTOTAL 62 06/22/2011   CKMB 2.0 06/22/2011   TROPONINI <0.30 06/22/2011    Lab Results  Component Value Date   CHOL 157 06/23/2011   CHOL  Value: 128        ATP III CLASSIFICATION:  <200     mg/dL   Desirable  161-096  mg/dL   Borderline High  >=045    mg/dL   High 01/08/8118   Lab Results  Component Value Date   HDL 46 06/23/2011   HDL 36* 06/15/2007   Lab Results  Component Value Date   LDLCALC 84 06/23/2011   LDLCALC  Value: 72        Total Cholesterol/HDL:CHD Risk Coronary Heart Disease Risk Table                     Men   Women  1/2 Average Risk   3.4   3.3 06/15/2007   Lab Results  Component Value Date   TRIG 136 06/23/2011   TRIG 101 06/15/2007   Lab Results  Component Value Date   CHOLHDL 3.4 06/23/2011   CHOLHDL 3.6 06/15/2007   No results found for this basename: LDLDIRECT      Radiology: NAD see above [place]  EKG:  SEE HPI  SR first degree otherwise normal  ASSESSMENT AND PLAN:  Chest Pain:  Previous false positive myovue. Known moderate ostial LAD disease Multiple episodes of pain relief with nitro  2012  Rx ASA beta blocker and heparin Favor cath  May need flow wire if lesion still borderline Patient is agreeable.  Will get left rib xrays as well given fall two weeks ago and some pain to palpation.   DM:  Hold glucophage for  cath  HTN:  Continue home meds  Chol:  Not on statin check lipids in am  LDL 84 2012    Signed: Jemiah Cuadra Nishan7/03/2013, 2:02 PM

## 2013-04-06 NOTE — Progress Notes (Signed)
Admission note   Data:Patient arrivied via stretcher at 1215 with EMS. Patient Alert and oriented x4. Patient denies c/o SOB, N/V.See echarting for additional data  Action: oriented patient to room, initiated admision protocol. Placed needed items within reach. enusured call bell active  Response: patient safe, neuro stable. Patient currently asymptomatic in sinus brady. VS otherwise stable

## 2013-04-06 NOTE — Progress Notes (Signed)
ANTICOAGULATION CONSULT NOTE - Initial Consult  Pharmacy Consult:  Heparin Indication:  ACS  No Known Allergies  Patient Measurements: Height: 5\' 2"  (157.5 cm) Weight: 190 lb (86.183 kg) IBW/kg (Calculated) : 50.1 Heparin Dosing Weight: 70 kg  Vital Signs:    Labs: No results found for this basename: HGB, HCT, PLT, APTT, LABPROT, INR, HEPARINUNFRC, CREATININE, CKTOTAL, CKMB, TROPONINI,  in the last 72 hours  The CrCl is unknown because both a height and weight (above a minimum accepted value) are required for this calculation.   Medical History: Past Medical History  Diagnosis Date  . CAD (coronary artery disease)     Cath 06/23/11.  LAD 50 - 60% ostial, EF 65%  . HTN (hypertension)   . HLD (hyperlipidemia)   . DM (diabetes mellitus)   . Obesity   . GERD (gastroesophageal reflux disease)   . Gout   . DJD (degenerative joint disease)   . OSA on CPAP   . Dyspnea     chronic        Assessment: 89 YOF presented to [facility] with CP, then transferred to [facility] for further management.  Pharmacy consulted to manage IV heparin for ACS.  Noted patient received Lovenox 80mg  SQ this AM around 0730.  Labs from [place] appropriate to start therapy.   Goal of Therapy:  Heparin level 0.3-0.7 units/ml Monitor platelets by anticoagulation protocol: Yes    Plan:  - At 1600, start IV heparin at 850 units/hr - Check 8 hr HL, watch for residual LMWH effect - Daily HL / CBC     Srijan Givan D. Laney Potash, PharmD, BCPS Pager:  [phone] 04/06/2013, 2:24 PM

## 2013-04-07 ENCOUNTER — Encounter (HOSPITAL_COMMUNITY)
Admission: AD | Disposition: A | Discharge status: Home / Self Care | Payer: Self-pay | Source: Other Acute Inpatient Hospital | Attending: Cardiology

## 2013-04-07 DIAGNOSIS — I2510 Atherosclerotic heart disease of native coronary artery without angina pectoris: Secondary | ICD-10-CM

## 2013-04-07 HISTORY — PX: LEFT HEART CATHETERIZATION WITH CORONARY ANGIOGRAM: SHX5451

## 2013-04-07 LAB — CBC
Hemoglobin: 11 g/dL — ABNORMAL LOW (ref 12.0–15.0)
MCH: 29.3 pg (ref 26.0–34.0)
MCV: 88.6 fL (ref 78.0–100.0)
RBC: 3.76 MIL/uL — ABNORMAL LOW (ref 3.87–5.11)

## 2013-04-07 LAB — BASIC METABOLIC PANEL
CO2: 27 mEq/L (ref 19–32)
Calcium: 8.7 mg/dL (ref 8.4–10.5)
GFR calc non Af Amer: 53 mL/min — ABNORMAL LOW (ref >90)
Glucose, Bld: 110 mg/dL — ABNORMAL HIGH (ref 70–99)
Potassium: 3.5 mEq/L (ref 3.5–5.1)
Sodium: 138 mEq/L (ref 135–145)

## 2013-04-07 LAB — PROTIME-INR
INR: 1.02 (ref 0.00–1.49)
Prothrombin Time: 13.2 seconds (ref 11.6–15.2)

## 2013-04-07 LAB — GLUCOSE, CAPILLARY
Glucose-Capillary: 140 mg/dL — ABNORMAL HIGH (ref 70–99)
Glucose-Capillary: 76 mg/dL (ref 70–99)
Glucose-Capillary: 82 mg/dL (ref 70–99)

## 2013-04-07 LAB — TROPONIN I: Troponin I: <0.3 ng/mL (ref <0.30)

## 2013-04-07 SURGERY — LEFT HEART CATHETERIZATION WITH CORONARY ANGIOGRAM
Anesthesia: LOCAL

## 2013-04-07 MED ORDER — LIDOCAINE HCL (PF) 1 % IJ SOLN
INTRAMUSCULAR | Status: AC
  Filled 2013-04-07: qty 30

## 2013-04-07 MED ORDER — FENTANYL CITRATE 0.05 MG/ML IJ SOLN
INTRAMUSCULAR | Status: AC
  Filled 2013-04-07: qty 2

## 2013-04-07 MED ORDER — NITROGLYCERIN 0.2 MG/ML ON CALL CATH LAB
INTRAVENOUS | Status: AC
  Filled 2013-04-07: qty 1

## 2013-04-07 MED ORDER — ACETAMINOPHEN 325 MG PO TABS: 650.0000 mg | ORAL_TABLET | ORAL | Status: DC | PRN

## 2013-04-07 MED ORDER — MIDAZOLAM HCL 2 MG/2ML IJ SOLN
INTRAMUSCULAR | Status: AC
  Filled 2013-04-07: qty 2

## 2013-04-07 MED ORDER — ONDANSETRON HCL 4 MG/2ML IJ SOLN: 4.0000 mg | Freq: Four times a day (QID) | INTRAMUSCULAR | Status: DC | PRN

## 2013-04-07 MED ORDER — VERAPAMIL HCL 2.5 MG/ML IV SOLN
INTRAVENOUS | Status: AC
  Filled 2013-04-07: qty 2

## 2013-04-07 MED ORDER — HEPARIN SODIUM (PORCINE) 1000 UNIT/ML IJ SOLN
INTRAMUSCULAR | Status: AC
  Filled 2013-04-07: qty 1

## 2013-04-07 MED ORDER — HEPARIN (PORCINE) IN NACL 2-0.9 UNIT/ML-% IJ SOLN
INTRAMUSCULAR | Status: AC
  Filled 2013-04-07: qty 1000

## 2013-04-07 MED ORDER — SODIUM CHLORIDE 0.9 % IV SOLN: INTRAVENOUS | Status: DC

## 2013-04-07 NOTE — Progress Notes (Signed)
CBC, CMP, PT/INR acquired at [facility]. See shadow chart for results.   Rochele Pages, RN

## 2013-04-07 NOTE — H&P (View-Only) (Signed)
 SUBJECTIVE:  No further chest pain.  No SOB.     PHYSICAL EXAM Filed Vitals:   04/06/13 1600 04/06/13 1955 04/06/13 2308 04/07/13 0433  BP:  124/55 124/63 132/58  Pulse:  62 64 62  Temp: 97.8 F (36.6 C) 97.7 F (36.5 C) 98.8 F (37.1 C) 97.7 F (36.5 C)  TempSrc: Oral Oral Oral Oral  Resp:  16 27 20  Height:      Weight:    194 lb 0.1 oz (88 kg)  SpO2:  99% 99% 99%   General:  No distress Lungs:  Clear Heart:  RRR Abdomen:  Positive bowel sounds, no rebound no guarding Extremities:  No edema  LABS: Lab Results  Component Value Date   TROPONINI <0.30 04/07/2013   Results for orders placed during the hospital encounter of 04/06/13 (from the past 24 hour(s))  MRSA PCR SCREENING     Status: None   Collection Time    04/06/13 12:28 PM      Result Value Range   MRSA by PCR NEGATIVE  NEGATIVE  GLUCOSE, CAPILLARY     Status: None   Collection Time    04/06/13  2:18 PM      Result Value Range   Glucose-Capillary 95  70 - 99 mg/dL  GLUCOSE, CAPILLARY     Status: Abnormal   Collection Time    04/06/13  5:19 PM      Result Value Range   Glucose-Capillary 113 (*) 70 - 99 mg/dL   Comment 1 Notify RN    GLUCOSE, CAPILLARY     Status: Abnormal   Collection Time    04/06/13 10:22 PM      Result Value Range   Glucose-Capillary 105 (*) 70 - 99 mg/dL   Comment 1 Notify RN    HEPARIN LEVEL (UNFRACTIONATED)     Status: None   Collection Time    04/07/13 12:40 AM      Result Value Range   Heparin Unfractionated 0.56  0.30 - 0.70 IU/mL  TROPONIN I     Status: None   Collection Time    04/07/13  4:00 AM      Result Value Range   Troponin I <0.30  <0.30 ng/mL  CBC     Status: Abnormal   Collection Time    04/07/13  4:00 AM      Result Value Range   WBC 7.3  4.0 - 10.5 K/uL   RBC 3.76 (*) 3.87 - 5.11 MIL/uL   Hemoglobin 11.0 (*) 12.0 - 15.0 g/dL   HCT 33.3 (*) 36.0 - 46.0 %   MCV 88.6  78.0 - 100.0 fL   MCH 29.3  26.0 - 34.0 pg   MCHC 33.0  30.0 - 36.0 g/dL   RDW  14.5  11.5 - 15.5 %   Platelets 177  150 - 400 K/uL    Intake/Output Summary (Last 24 hours) at 04/07/13 0817 Last data filed at 04/07/13 0700  Gross per 24 hour  Intake  240.5 ml  Output   2150 ml  Net -1909.5 ml    EKG:  Sinus rhythm, rate 58, axis within normal limits, intervals within normal limits, no acute ST-T wave changes.  04/07/2013   ASSESSMENT AND PLAN:  CHEST PAIN:  Enzymes negative.  No abnormalities on EKG.  Plan cath this afternoon.   HTN:  BP OK.  Continue current meds.    DM:   Blood sugar OK since admission.  Continue current therapy.     [name] 04/07/2013 8:17 AM   

## 2013-04-07 NOTE — Interval H&P Note (Signed)
History and Physical Interval Note:  04/07/2013 4:05 PM  [name]  has presented today for surgery, with the diagnosis of cp  The various methods of treatment have been discussed with the patient and family. After consideration of risks, benefits and other options for treatment, the patient has consented to  Procedure(s): LEFT HEART CATHETERIZATION WITH CORONARY ANGIOGRAM (N/A) as a surgical intervention .  The patient's history has been reviewed, patient examined, no change in status, stable for surgery.  I have reviewed the patient's chart and labs.  Questions were answered to the patient's satisfaction.     Elyn Aquas.

## 2013-04-07 NOTE — Progress Notes (Signed)
ANTICOAGULATION CONSULT NOTE - Follow Up Consult  Pharmacy Consult for heparin Indication: chest pain/ACS  Labs:  Recent Labs  04/07/13 0040  HEPARINUNFRC 0.56     Assessment/Plan:  79yo female therapeutic on heparin with initial dosing for CP.  Will continue gtt at current rate and confirm stable with additional level.  Vernard Gambles, PharmD, BCPS  04/07/2013,1:53 AM

## 2013-04-07 NOTE — Progress Notes (Signed)
Utilization review completed.  

## 2013-04-07 NOTE — Progress Notes (Signed)
SUBJECTIVE:  No further chest pain.  No SOB.     PHYSICAL EXAM Filed Vitals:   04/06/13 1600 04/06/13 1955 04/06/13 2308 04/07/13 0433  BP:  124/55 124/63 132/58  Pulse:  62 64 62  Temp: 97.8 F (36.6 C) 97.7 F (36.5 C) 98.8 F (37.1 C) 97.7 F (36.5 C)  TempSrc: Oral Oral Oral Oral  Resp:  16 27 20   Height:      Weight:    194 lb 0.1 oz (88 kg)  SpO2:  99% 99% 99%   General:  No distress Lungs:  Clear Heart:  RRR Abdomen:  Positive bowel sounds, no rebound no guarding Extremities:  No edema  LABS: Lab Results  Component Value Date   TROPONINI <0.30 04/07/2013   Results for orders placed during the hospital encounter of 04/06/13 (from the past 24 hour(s))  MRSA PCR SCREENING     Status: None   Collection Time    04/06/13 12:28 PM      Result Value Range   MRSA by PCR NEGATIVE  NEGATIVE  GLUCOSE, CAPILLARY     Status: None   Collection Time    04/06/13  2:18 PM      Result Value Range   Glucose-Capillary 95  70 - 99 mg/dL  GLUCOSE, CAPILLARY     Status: Abnormal   Collection Time    04/06/13  5:19 PM      Result Value Range   Glucose-Capillary 113 (*) 70 - 99 mg/dL   Comment 1 Notify RN    GLUCOSE, CAPILLARY     Status: Abnormal   Collection Time    04/06/13 10:22 PM      Result Value Range   Glucose-Capillary 105 (*) 70 - 99 mg/dL   Comment 1 Notify RN    HEPARIN LEVEL (UNFRACTIONATED)     Status: None   Collection Time    04/07/13 12:40 AM      Result Value Range   Heparin Unfractionated 0.56  0.30 - 0.70 IU/mL  TROPONIN I     Status: None   Collection Time    04/07/13  4:00 AM      Result Value Range   Troponin I <0.30  <0.30 ng/mL  CBC     Status: Abnormal   Collection Time    04/07/13  4:00 AM      Result Value Range   WBC 7.3  4.0 - 10.5 K/uL   RBC 3.76 (*) 3.87 - 5.11 MIL/uL   Hemoglobin 11.0 (*) 12.0 - 15.0 g/dL   HCT 40.9 (*) 81.1 - 91.4 %   MCV 88.6  78.0 - 100.0 fL   MCH 29.3  26.0 - 34.0 pg   MCHC 33.0  30.0 - 36.0 g/dL   RDW  78.2  95.6 - 21.3 %   Platelets 177  150 - 400 K/uL    Intake/Output Summary (Last 24 hours) at 04/07/13 0817 Last data filed at 04/07/13 0700  Gross per 24 hour  Intake  240.5 ml  Output   2150 ml  Net -1909.5 ml    EKG:  Sinus rhythm, rate 58, axis within normal limits, intervals within normal limits, no acute ST-T wave changes.  04/07/2013   ASSESSMENT AND PLAN:  CHEST PAIN:  Enzymes negative.  No abnormalities on EKG.  Plan cath this afternoon.   HTN:  BP OK.  Continue current meds.    DM:   Blood sugar OK since admission.  Continue current therapy.  Fayrene Fearing [facility] 04/07/2013 8:17 AM

## 2013-04-07 NOTE — CV Procedure (Signed)
    Cardiac Cath Note  [name] [id number] 1931/10/22  Procedure: Left  Heart Cardiac Catheterization Note Indications: CP, hx of mild - moderate CAD  Procedure Details Consent: Obtained Time Out: Verified patient identification, verified procedure, site/side was marked, verified correct patient position, special equipment/implants available, Radiology Safety Procedures followed,  medications/allergies/relevent history reviewed, required imaging and test results available.  Performed   Medications: Fentanyl: 50 mcg IV Versed: 2 mg IV Verapamil 3 mg IA Heparin 5000 units  The right radial  artery was easily canulated using a modified Seldinger technique.  Hemodynamics:    LV pressure: 151/8/21 Aortic pressure: 151/60  Angiography   Left Main: large, smooth and normal  Left anterior Descending: 40-50% stenosis at the origin.  The remainder of the LAD is normal.  2 small diags are normal  Left Circumflex: smooth and normal,  OM 1 and OM 2 are normal.  Right Coronary Artery: large, normal, PDA, PLSA are normal  LV Gram: vigorous LV function.  EF 70%  Complications: No apparent complications Patient did tolerate procedure well.  Contrast used: 75 cc  Conclusions:   Mild CAD.  The stenosis is not significantly different from her previous cath several years ago.   Vigorous LV function.  Vesta Mixer, Montez Hageman., MD, [facility] 04/07/2013, 4:46 PM Office - [phone] Pager [phone]

## 2013-04-07 NOTE — Progress Notes (Signed)
Pt transferred to cardica cath lab, report called to receiving nurse and all questions answered.

## 2013-04-08 ENCOUNTER — Encounter (HOSPITAL_COMMUNITY): Payer: Self-pay | Admitting: Nurse Practitioner

## 2013-04-08 DIAGNOSIS — I2510 Atherosclerotic heart disease of native coronary artery without angina pectoris: Secondary | ICD-10-CM

## 2013-04-08 DIAGNOSIS — R0789 Other chest pain: Secondary | ICD-10-CM

## 2013-04-08 LAB — CBC
MCH: 29.6 pg (ref 26.0–34.0)
MCHC: 33.1 g/dL (ref 30.0–36.0)
Platelets: 171 10*3/uL (ref 150–400)
RBC: 3.78 MIL/uL — ABNORMAL LOW (ref 3.87–5.11)

## 2013-04-08 MED ORDER — METFORMIN HCL 500 MG PO TABS: 250.0000 mg | ORAL_TABLET | Freq: Two times a day (BID) | ORAL | Status: DC

## 2013-04-08 MED ORDER — ATORVASTATIN CALCIUM 10 MG PO TABS: 10.0000 mg | ORAL_TABLET | Freq: Every day | ORAL | Status: DC

## 2013-04-08 NOTE — Discharge Summary (Signed)
Patient ID: [name],  MRN: [id number], DOB/AGE: 03/21/1932 79 y.o.  Admit date: 04/06/2013 Discharge date: 04/08/2013  Primary Care Provider: Dr. Lois Huxley  -  [place], [place] Primary Cardiologist: J. Pharoah Goggins, MD   Discharge Diagnoses Principal Problem:   Midsternal chest pain  **s/p catheterization this admission revealing nonobstructive CAD.  Active Problems:   HTN (hypertension)   Diabetes mellitus   CAD (coronary artery disease)   Dyslipidemia   Allergies No Known Allergies  Procedures  Cardiac Catheterization 04/07/2013  Hemodynamics:    LV pressure: 151/8/21 Aortic pressure: 151/60  Angiography    Left Main: large, smooth and normal Left anterior Descending: 40-50% stenosis at the origin.  The remainder of the LAD is normal.  2 small diags are normal Left Circumflex: smooth and normal,  OM 1 and OM 2 are normal. Right Coronary Artery: large, normal, PDA, PLSA are normal LV Gram: vigorous LV function.  EF 70%  History of Present Illness  79 year old female with history of nonobstructive coronary artery disease and chest pain who was in her usual state of health until the night prior to admission when she began to experience substernal chest discomfort. She presented to [facility] where symptoms resolved with sublingual nitroglycerin. ECG was nonacute and initial cardiac markers were negative. She was transferred to [facility] for further evaluation.  Hospital Course  Patient ruled out for myocardial infarction. She had no further chest pain. Decision was made to pursue diagnostic cardiac catheterization which showed nonobstructive coronary artery disease and normal LV function. Post procedure, patient has been ambulating without recurrent symptoms or limitations and will be discharged home today in good condition. Of note, in the setting of atherosclerosis with a history of diabetes, we have added low-dose statin therapy and she will require f/u lipids and  lft's in 8 weeks.  Discharge Vitals Blood pressure 162/56, pulse 59, temperature 97.7 F (36.5 C), temperature source Oral, resp. rate 18, height 5\' 2"  (1.575 m), weight 193 lb 5.5 oz (87.7 kg), SpO2 97.00%.  Filed Weights   04/06/13 1300 04/07/13 0433 04/08/13 0034  Weight: 199 lb 1.2 oz (90.3 kg) 194 lb 0.1 oz (88 kg) 193 lb 5.5 oz (87.7 kg)    Labs  CBC  Recent Labs  04/07/13 0400 04/08/13 0510  WBC 7.3 5.7  HGB 11.0* 11.2*  HCT 33.3* 33.8*  MCV 88.6 89.4  PLT 177 171   Basic Metabolic Panel  Recent Labs  04/07/13 1225  NA 138  K 3.5  CL 104  CO2 27  GLUCOSE 110*  BUN 17  CREATININE 0.99  CALCIUM 8.7   Cardiac Enzymes  Recent Labs  04/07/13 0400  TROPONINI <0.30   Thyroid Function Tests  Recent Labs  04/07/13 0400  TSH 3.561   Disposition  Pt is being discharged home today in good condition.  Follow-up Plans & Appointments      Follow-up Information   Follow up with DAVIS,Erron Wengert W, MD. (1-2 wks)    Contact information:   [address] [place] [address] [phone]       Follow up with Tereso Newcomer, PA-C On 05/08/2013. (11:30 AM)    Contact information:   [address] [place] [address] [phone]      Discharge Medications    Medication List         ALLOPURINOL PO  Take 100 mg by mouth daily.     aspirin 81 MG chewable tablet  Chew 81  mg by mouth daily.     atorvastatin 10 MG tablet  Commonly known as:  LIPITOR  Take 1 tablet (10 mg total) by mouth daily.     carvedilol 25 MG tablet  Commonly known as:  COREG  Take 25 mg by mouth 2 (two) times daily with a meal.     GLUCOSAMINE PO  Take by mouth daily.     levothyroxine 25 MCG tablet  Commonly known as:  SYNTHROID, LEVOTHROID  Take 25 mcg by mouth daily.     lisinopril 40 MG tablet  Commonly known as:  PRINIVIL,ZESTRIL  Take 40 mg by mouth daily.     losartan 100 MG tablet  Commonly known as:  COZAAR  Take 100 mg by mouth daily.       metFORMIN 500 MG tablet  Commonly known as:  GLUCOPHAGE  Take 0.5 tablets (250 mg total) by mouth 2 (two) times daily with a meal.     multivitamin per tablet  Take 1 tablet by mouth daily.     nitroGLYCERIN 0.4 MG SL tablet  Commonly known as:  NITROSTAT  Place 0.4 mg under the tongue every 5 (five) minutes as needed for chest pain.     SYSTANE OP  Place 1 drop into both eyes as needed.     TOPAMAX PO  Take 50 mg by mouth 4 (four) times daily.     torsemide 20 MG tablet  Commonly known as:  DEMADEX  Take 40 mg by mouth daily.       Outstanding Labs/Studies  Follow-up lipids and lft's in 8 wks.  Duration of Discharge Encounter   Greater than 30 minutes including physician time.  Signed, Nicolasa Ducking NP 04/08/2013, 8:43 AM   Patient seen and examined.  Plan as discussed in my rounding note for today and outlined above. Rollene Rotunda  04/08/2013  10:31 AM

## 2013-04-08 NOTE — Progress Notes (Signed)
SUBJECTIVE:  No further chest pain.  No SOB.  Cath results as above.    PHYSICAL EXAM Filed Vitals:   04/07/13 2100 04/08/13 0034 04/08/13 0609 04/08/13 0805  BP: 114/39 137/42 138/61 162/56  Pulse: 60 58 62 59  Temp:  98.1 F (36.7 C) 97.8 F (36.6 C) 97.7 F (36.5 C)  TempSrc:  Oral Oral Oral  Resp:  18 20 18   Height:      Weight:  193 lb 5.5 oz (87.7 kg)    SpO2: 95% 95% 95% 97%   General:  No distress Lungs:  Clear Heart:  RRR Abdomen:  Positive bowel sounds, no rebound no guarding Extremities:  No edema, right wrist with some bruising.   LABS: Lab Results  Component Value Date   TROPONINI <0.30 04/07/2013   Results for orders placed during the hospital encounter of 04/06/13 (from the past 24 hour(s))  GLUCOSE, CAPILLARY     Status: Abnormal   Collection Time    04/07/13 11:36 AM      Result Value Range   Glucose-Capillary 140 (*) 70 - 99 mg/dL  BASIC METABOLIC PANEL     Status: Abnormal   Collection Time    04/07/13 12:25 PM      Result Value Range   Sodium 138  135 - 145 mEq/L   Potassium 3.5  3.5 - 5.1 mEq/L   Chloride 104  96 - 112 mEq/L   CO2 27  19 - 32 mEq/L   Glucose, Bld 110 (*) 70 - 99 mg/dL   BUN 17  6 - 23 mg/dL   Creatinine, Ser 4.54  0.50 - 1.10 mg/dL   Calcium 8.7  8.4 - 09.8 mg/dL   GFR calc non Af Amer 53 (*) >90 mL/min   GFR calc Af Amer 62 (*) >90 mL/min  PROTIME-INR     Status: None   Collection Time    04/07/13 12:25 PM      Result Value Range   Prothrombin Time 13.2  11.6 - 15.2 seconds   INR 1.02  0.00 - 1.49  GLUCOSE, CAPILLARY     Status: None   Collection Time    04/07/13  4:58 PM      Result Value Range   Glucose-Capillary 76  70 - 99 mg/dL  GLUCOSE, CAPILLARY     Status: None   Collection Time    04/07/13  5:59 PM      Result Value Range   Glucose-Capillary 82  70 - 99 mg/dL  GLUCOSE, CAPILLARY     Status: Abnormal   Collection Time    04/07/13  9:33 PM      Result Value Range   Glucose-Capillary 132 (*) 70 - 99  mg/dL   Comment 1 Notify RN     Comment 2 Documented in Chart    CBC     Status: Abnormal   Collection Time    04/08/13  5:10 AM      Result Value Range   WBC 5.7  4.0 - 10.5 K/uL   RBC 3.78 (*) 3.87 - 5.11 MIL/uL   Hemoglobin 11.2 (*) 12.0 - 15.0 g/dL   HCT 11.9 (*) 14.7 - 82.9 %   MCV 89.4  78.0 - 100.0 fL   MCH 29.6  26.0 - 34.0 pg   MCHC 33.1  30.0 - 36.0 g/dL   RDW 56.2  13.0 - 86.5 %   Platelets 171  150 - 400 K/uL    Intake/Output Summary (  Last 24 hours) at 04/08/13 0814 Last data filed at 04/08/13 0600  Gross per 24 hour  Intake  869.5 ml  Output   1700 ml  Net -830.5 ml    ASSESSMENT AND PLAN:  CHEST PAIN:  Enzymes negative.  No abnormalities on EKG.  Cath anatomy unchanged.  No further cardiac work up.    HTN:  BP OK.  Continue current meds.    DM:   Blood sugar OK since admission.  Continue current therapy.   Follow up one month.  Fayrene Fearing [facility] 04/08/2013 8:14 AM

## 2013-05-08 ENCOUNTER — Encounter: Payer: Self-pay | Admitting: Physician Assistant

## 2013-05-08 ENCOUNTER — Ambulatory Visit (INDEPENDENT_AMBULATORY_CARE_PROVIDER_SITE_OTHER): Payer: Medicare Other | Admitting: Physician Assistant

## 2013-05-08 VITALS — BP 153/78 | HR 60 | Ht 62.0 in | Wt 194.0 lb

## 2013-05-08 DIAGNOSIS — I2510 Atherosclerotic heart disease of native coronary artery without angina pectoris: Secondary | ICD-10-CM

## 2013-05-08 DIAGNOSIS — E785 Hyperlipidemia, unspecified: Secondary | ICD-10-CM

## 2013-05-08 DIAGNOSIS — I10 Essential (primary) hypertension: Secondary | ICD-10-CM

## 2013-05-08 NOTE — Patient Instructions (Addendum)
Your physician recommends that you continue on your current medications as directed. Please refer to the Current Medication list given to you today.  Your physician recommends that you schedule a follow-up appointment in: 1 year Dr.Hochrein

## 2013-05-08 NOTE — Progress Notes (Signed)
[address] [place], [place]  [address] Phone: [phone] Fax:  [phone]  Date:  05/08/2013   ID:  Christine, Casey 05-10-1932, MRN [id number]  PCP:  Christine Richmond, MD  Cardiologist:  Dr. Rollene Casey     History of Present Illness: [name] is a 79 y.o. female who returns for follow up after a recent admission to the hospital 7/6-7/8 for chest pain. She has a history of DM2, HTN, HL and nonobstructive CAD by cardiac catheterization in the past. She presented to the hospital with chest discomfort relieved by sublingual nitroglycerin. She ruled out for myocardial infarction. LHC 04/07/13: LAD 40-50%, EF 70%. Continued medical therapy was recommended. Since discharge, she has been doing well.  The patient denies chest pain, shortness of breath, syncope, orthopnea, PND or significant pedal edema.   Labs (7/14):  K 3.5, Cr 0.99, Hgb 11.2, TSH 3.561  Wt Readings from Last 3 Encounters:  05/08/13 194 lb (87.998 kg)  04/08/13 193 lb 5.5 oz (87.7 kg)  04/08/13 193 lb 5.5 oz (87.7 kg)     Past Medical History  Diagnosis Date  . CAD (coronary artery disease)     a. Cath 06/23/11.  LAD 50 - 60% ostial, EF 65%;  b. 04/2013 Cath: LM nl, LAD 40-50 ost, LCX nl, OM1/2 nl, RCA/PDA/PLSA nl, EF 70%.  Marland Kitchen HTN (hypertension)   . HLD (hyperlipidemia)   . DM (diabetes mellitus)   . Obesity   . GERD (gastroesophageal reflux disease)   . Gout   . DJD (degenerative joint disease)   . OSA on CPAP   . Dyspnea     chronic    Current Outpatient Prescriptions  Medication Sig Dispense Refill  . Acetaminophen (TYLENOL ARTHRITIS EXT RELIEF PO) Take by mouth 2 (two) times daily.       . ALLOPURINOL PO Take 100 mg by mouth daily.       Marland Kitchen amLODipine (NORVASC) 5 MG tablet       . aspirin 81 MG chewable tablet Chew 81 mg by mouth daily.        Marland Kitchen atorvastatin (LIPITOR) 10 MG tablet Take 1 tablet (10 mg total) by mouth daily.  30 tablet  6  . carvedilol (COREG) 25 MG tablet Take  25 mg by mouth 2 (two) times daily with a meal.       . GLUCOSAMINE PO Take by mouth 2 (two) times daily.       Marland Kitchen HYDROcodone-acetaminophen (NORCO/VICODIN) 5-325 MG per tablet       . levothyroxine (SYNTHROID, LEVOTHROID) 25 MCG tablet Take 25 mcg by mouth daily.        Marland Kitchen lisinopril (PRINIVIL,ZESTRIL) 40 MG tablet Take 40 mg by mouth daily.        Marland Kitchen losartan (COZAAR) 100 MG tablet Take 100 mg by mouth daily.        . metFORMIN (GLUCOPHAGE) 500 MG tablet Take 0.5 tablets (250 mg total) by mouth 2 (two) times daily with a meal.      . multivitamin (THERAGRAN) per tablet Take 1 tablet by mouth daily.        . nitroGLYCERIN (NITROSTAT) 0.4 MG SL tablet Place 0.4 mg under the tongue every 5 (five) minutes as needed for chest pain.      Marland Kitchen omeprazole (PRILOSEC) 20 MG capsule       . Polyethyl Glycol-Propyl Glycol (SYSTANE OP) Place 1 drop into both eyes as needed.        Marland Kitchen  Topiramate (TOPAMAX PO) Take 50 mg by mouth 4 (four) times daily.       Marland Kitchen torsemide (DEMADEX) 20 MG tablet Take 40 mg by mouth daily.        No current facility-administered medications for this visit.    Allergies:   No Known Allergies  Social History:  The patient  reports that she has never smoked. She has never used smokeless tobacco. She reports that she does not drink alcohol or use illicit drugs.   ROS:  Please see the history of present illness.      All other systems reviewed and negative.   PHYSICAL EXAM: VS:  BP 153/78  Pulse 60  Ht 5\' 2"  (1.575 m)  Wt 194 lb (87.998 kg)  BMI 35.47 kg/m2 Well nourished, well developed, in no acute distress HEENT: normal Neck: no JVD Cardiac:  normal S1, S2; RRR; no murmur Lungs:  clear to auscultation bilaterally, no wheezing, rhonchi or rales Abd: soft, nontender, no hepatomegaly Ext: no edema; right wrist without hematoma or mass  Skin: warm and dry Neuro:  CNs 2-12 intact, no focal abnormalities noted  EKG:  NSR, normal axis, no ST changes     ASSESSMENT AND  PLAN:  1. CAD: Nonobstructive disease by recent cardiac catheterization. Continue aspirin, statin. 2. Hypertension: Blood pressure is usually better controlled at home. She tells that she has an element of "whitecoat hypertension."  Continue current therapy. 3. Hyperlipidemia: Continue statin. 4. Disposition: Follow up with Dr. Antoine Casey in one year.  Signed, Tereso Newcomer, PA-C  05/08/2013 12:18 PM

## 2014-08-07 ENCOUNTER — Encounter: Payer: Self-pay | Admitting: Cardiology

## 2014-09-10 ENCOUNTER — Encounter (HOSPITAL_COMMUNITY): Payer: Self-pay | Admitting: Cardiovascular Disease

## 2015-06-28 ENCOUNTER — Other Ambulatory Visit: Payer: Self-pay | Admitting: Anesthesiology

## 2015-06-28 DIAGNOSIS — M5416 Radiculopathy, lumbar region: Secondary | ICD-10-CM

## 2015-07-14 ENCOUNTER — Ambulatory Visit
Admission: RE | Admit: 2015-07-14 | Discharge: 2015-07-14 | Disposition: A | Discharge status: Home / Self Care | Payer: Medicare Other | Source: Ambulatory Visit | Attending: Anesthesiology | Admitting: Anesthesiology

## 2015-07-14 DIAGNOSIS — M5416 Radiculopathy, lumbar region: Secondary | ICD-10-CM

## 2016-08-09 ENCOUNTER — Encounter: Payer: Self-pay | Admitting: Cardiology

## 2016-08-09 ENCOUNTER — Ambulatory Visit (INDEPENDENT_AMBULATORY_CARE_PROVIDER_SITE_OTHER): Payer: Medicare Other | Admitting: Cardiology

## 2016-08-09 VITALS — BP 138/84 | HR 64 | Ht 62.0 in | Wt 212.6 lb

## 2016-08-09 DIAGNOSIS — E6609 Other obesity due to excess calories: Secondary | ICD-10-CM | POA: Diagnosis not present

## 2016-08-09 DIAGNOSIS — I480 Paroxysmal atrial fibrillation: Secondary | ICD-10-CM

## 2016-08-09 DIAGNOSIS — I10 Essential (primary) hypertension: Secondary | ICD-10-CM

## 2016-08-09 DIAGNOSIS — Z7901 Long term (current) use of anticoagulants: Secondary | ICD-10-CM

## 2016-08-09 DIAGNOSIS — G4733 Obstructive sleep apnea (adult) (pediatric): Secondary | ICD-10-CM | POA: Diagnosis not present

## 2016-08-09 MED ORDER — DILTIAZEM HCL ER COATED BEADS 180 MG PO CP24: 180.0000 mg | ORAL_CAPSULE | Freq: Every day | ORAL | 11 refills | Status: DC

## 2016-08-09 MED ORDER — RIVAROXABAN 20 MG PO TABS: 20.0000 mg | ORAL_TABLET | Freq: Every day | ORAL | 11 refills | Status: DC

## 2016-08-09 NOTE — Patient Instructions (Signed)
Medication Instructions:  Please increase Diltiazem to 180 mg a day. Increase Xarelto to 20 mg a day. Continue all other medications as listed.  Follow-Up: Follow up in 3 months with Bary CastillaKaty Thompson, PA.  If you need a refill on your cardiac medications before your next appointment, please call your pharmacy.  Thank you for choosing [facility]!!

## 2016-08-09 NOTE — Progress Notes (Signed)
Cardiology Office Note    Date:  08/09/2016   ID:  [name], DOB Dec 13, 1931, MRN [id number]  PCP:  Carmin Richmond, MD  Cardiologist:   Donato Schultz, MD     History of Present Illness:  [name] is a 82 y.o. female here for follow-up of chest discomfort recently seen in the emergency department. In review of prior notes, in 2014 she was hospitalized for chest discomfort and underwent cardiac catheterization that showed nonobstructive CAD. On 04/07/13: LAD 40-50%, EF 70%. Medical therapy.  Shifts at a hospital stay for new onset atrial fibrillation. She underwent echocardiogram that showed ejection fraction 65%, normal left atrial size, mild mitral annular calcification. This was performed on 06/28/16.  Dr. Natasha Bence saw her at [facility]. She was started on diltiazem as well as Xarelto. No symptoms.  Currently she will occasionally have rapid heart rate at night around 2 AM that sometimes wakes her up. This did not happen much for possibly 2 weeks after hospitalization and starting diltiazem. She is noticing this more.  EKG on 12/10/15 shows sinus rhythm with PACs.  LDL 72, TSH normal at 2.61, ALT 19, creatinine 0.84, potassium 4.4, hemoglobin 13.7.   Past Medical History:  Diagnosis Date  . CAD (coronary artery disease)    a. Cath 06/23/11.  LAD 50 - 60% ostial, EF 65%;  b. 04/2013 Cath: LM nl, LAD 40-50 ost, LCX nl, OM1/2 nl, RCA/PDA/PLSA nl, EF 70%.  . DJD (degenerative joint disease)   . DM (diabetes mellitus) (HCC)   . Dyspnea    chronic  . GERD (gastroesophageal reflux disease)   . Gout   . HLD (hyperlipidemia)   . HTN (hypertension)   . Obesity   . OSA on CPAP     Past Surgical History:  Procedure Laterality Date  . BACK SURGERY    . CARDIAC CATHETERIZATION  06-23-2011   left heart  . CHOLECYSTECTOMY    . LEFT HEART CATHETERIZATION WITH CORONARY ANGIOGRAM N/A 04/07/2013   Procedure: LEFT HEART CATHETERIZATION WITH CORONARY ANGIOGRAM;  Surgeon:  Vesta Mixer, MD;  Location: [facility] CATH LAB;  Service: Cardiovascular;  Laterality: N/A;  . TOTAL KNEE ARTHROPLASTY     bilateral    Current Medications: Outpatient Medications Prior to Visit  Medication Sig Dispense Refill  . Acetaminophen (TYLENOL ARTHRITIS EXT RELIEF PO) Take by mouth 2 (two) times daily.     . ALLOPURINOL PO Take 100 mg by mouth daily.     Marland Kitchen amLODipine (NORVASC) 5 MG tablet     . atorvastatin (LIPITOR) 10 MG tablet Take 1 tablet (10 mg total) by mouth daily. 30 tablet 6  . carvedilol (COREG) 25 MG tablet Take 25 mg by mouth 2 (two) times daily with a meal.     . GLUCOSAMINE PO Take by mouth 2 (two) times daily.     Marland Kitchen HYDROcodone-acetaminophen (NORCO/VICODIN) 5-325 MG per tablet     . levothyroxine (SYNTHROID, LEVOTHROID) 25 MCG tablet Take 25 mcg by mouth daily.      Marland Kitchen losartan (COZAAR) 100 MG tablet Take 100 mg by mouth daily.      . nitroGLYCERIN (NITROSTAT) 0.4 MG SL tablet Place 0.4 mg under the tongue every 5 (five) minutes as needed for chest pain.    Marland Kitchen omeprazole (PRILOSEC) 20 MG capsule     . Polyethyl Glycol-Propyl Glycol (SYSTANE OP) Place 1 drop into both eyes as needed.      . Topiramate (TOPAMAX PO) Take 50  mg by mouth 4 (four) times daily.     Marland Kitchen. torsemide (DEMADEX) 20 MG tablet Take 40 mg by mouth daily.     Marland Kitchen. aspirin 81 MG chewable tablet Chew 81 mg by mouth daily.      Marland Kitchen. lisinopril (PRINIVIL,ZESTRIL) 40 MG tablet Take 40 mg by mouth daily.      . metFORMIN (GLUCOPHAGE) 500 MG tablet Take 0.5 tablets (250 mg total) by mouth 2 (two) times daily with a meal.    . multivitamin (THERAGRAN) per tablet Take 1 tablet by mouth daily.       No facility-administered medications prior to visit.     She is on diltiazem 120 once a day. She is off of her aspirin. She is on Xarelto 15.  Allergies:   Patient has no known allergies.   Social History   Social History  . Marital status: Married    Spouse name: N/A  . Number of children: N/A  . Years of  education: N/A   Social History Main Topics  . Smoking status: Never Smoker  . Smokeless tobacco: Never Used  . Alcohol use No  . Drug use: No  . Sexual activity: Not Asked   Other Topics Concern  . None   Social History Narrative  . None     Family History:  The patient's family history includes Coronary artery disease in her mother.   ROS:   Please see the history of present illness.    ROS All other systems reviewed and are negative.   PHYSICAL EXAM:   VS:  BP 138/84   Pulse 64   Ht 5\' 2"  (1.575 m)   Wt 212 lb 9.6 oz (96.4 kg)   LMP  (LMP Unknown)   BMI 38.89 kg/m    GEN: Well nourished, well developed, in no acute distress  HEENT: normal  Neck: no JVD, carotid bruits, or masses Cardiac: RRR; no murmurs, rubs, or gallops,no edema  Respiratory:  clear to auscultation bilaterally, normal work of breathing GI: soft, nontender, nondistended, + BS, obese MS: no deformity or atrophy  Skin: warm and dry, no rash Neuro:  Alert and Oriented x 3, Strength and sensation are intact Psych: euthymic mood, full affect  Wt Readings from Last 3 Encounters:  08/09/16 212 lb 9.6 oz (96.4 kg)  05/08/13 194 lb (88 kg)  04/08/13 193 lb 5.5 oz (87.7 kg)      Studies/Labs Reviewed:   EKG:  EKG is ordered today.  The ekg ordered today demonstrates 08/09/16-sinus rhythm, poor R-wave progression heart rate 64 bpm personally viewed.  Recent Labs: No results found for requested labs within last 8760 hours.   Lipid Panel    Component Value Date/Time   CHOL 157 06/23/2011 0218   TRIG 136 06/23/2011 0218   HDL 46 06/23/2011 0218   CHOLHDL 3.4 06/23/2011 0218   VLDL 27 06/23/2011 0218   LDLCALC 84 06/23/2011 0218    Additional studies/ records that were reviewed today include:  Prior office notes, EKG, echocardiogram reviewed.    ASSESSMENT:    1. Paroxysmal atrial fibrillation (HCC)   2. Chronic anticoagulation   3. OSA (obstructive sleep apnea)   4. Class 1 obesity due  to excess calories with serious comorbidity in adult, unspecified BMI   5. Essential hypertension      PLAN:  In order of problems listed above:  Paroxysmal atrial fibrillation  - Recently diagnosed in the hospital here at echocardiogram reassuring. She does have moderate nonobstructive  CAD of her LAD 40-50%. She is feeling occasional palpitations at 2 AM periodically. This tends to wake her up at times. Right after she got out of the hospital with the newly administered diltiazem these did not seem to be present. Because of this, I will increase her diltiazem to 180 mg once a day. Note, she is already taking carvedilol at max dose. Current heart rate 64 bpm. If we must, we may need to choose an antiarrhythmic in the future.  Chronic anticoagulation  - Continue with Xarelto but change dosing to 20 mg once a day. Most recent creatinine was 0.84. No bleeding issues. If her insurance mandates, we may need to switch to Eliquis 5 mg twice a day.  Obstructive sleep apnea  - Continue with CPAP.  Obesity  - Continue to encourage weight loss.  Essential hypertension  - Controlled today. Periodically she will have increased blood pressure after episodes of atrial fibrillation.    Medication Adjustments/Labs and Tests Ordered: Current medicines are reviewed at length with the patient today.  Concerns regarding medicines are outlined above.  Medication changes, Labs and Tests ordered today are listed in the Patient Instructions below. Patient Instructions  Medication Instructions:  Please increase Diltiazem to 180 mg a day. Increase Xarelto to 20 mg a day. Continue all other medications as listed.  Follow-Up: Follow up in 3 months with Bary CastillaKaty Thompson, PA.  If you need a refill on your cardiac medications before your next appointment, please call your pharmacy.  Thank you for choosing [facility]!!        Signed, Donato SchultzMark Marrah Vanevery, MD  08/09/2016 1:33 PM    [facility] [address], [place], [place]  [address] Phone: [phone]) [phone]; Fax: [phone]

## 2016-08-09 NOTE — Addendum Note (Signed)
Addended by: Sydnee CabalMACK, Marshawn Normoyle R on: 08/09/2016 01:51 PM   Modules accepted: Orders

## 2016-11-09 ENCOUNTER — Encounter: Payer: Self-pay | Admitting: Physician Assistant

## 2016-11-10 NOTE — Progress Notes (Signed)
Cardiology Office Note    Date:  11/13/2016   ID:  [name], DOB [birth year], MRN [id number]  PCP:  Carmin RichmondAVIS,JAMES W, MD  Cardiologist:  Dr. Anne FuSkains  CC: 3 month follow up  History of Present Illness:  [name] is a 83 y.o. female with a history of PAF on Xarelto, HTN, OSA on CPAP, obesity, non obstructive CAD, chronic diastolic CHF and obesity who presents to clinic for follow up.  She had cath in 2014 that showed non obst CAD with 40-50% LAD occl and normal LV function. She was admitted for new onset afib in 06/2016. 2D ECHO showed EF 65%, normal left atrial size, mild mitral annular calcification. She was started on Xarelto.  She was seen in the office by Dr. Anne FuSkains in 08/2016 and diltiazem increased  to 180 mg to help treat nocturnal palpitations. Also dose of Xarelto increased to 20mg  daily.   Today she presents to clinic for follow up. Overall, feeling pretty good with improved palpitations. She has some LE edema and saw her nephrologist who told her to cut back her salt and increased torsemide to 100mg  daily. This helped tremendously and LE edema resolved. No CP or SOB. No orthopnea or PND. No dizziness or syncope. No blood in stool or urine. No palpitations. Not very active but does get around and do errands like go to grocery store with no problems.      Past Medical History:  Diagnosis Date  . CAD (coronary artery disease)    a. Cath 06/23/11.  LAD 50 - 60% ostial, EF 65%;  b. 04/2013 Cath: LM nl, LAD 40-50 ost, LCX nl, OM1/2 nl, RCA/PDA/PLSA nl, EF 70%.  . DJD (degenerative joint disease)   . DM (diabetes mellitus) (HCC)   . Dyspnea    chronic  . GERD (gastroesophageal reflux disease)   . Gout   . HLD (hyperlipidemia)   . HTN (hypertension)   . Obesity   . OSA on CPAP     Past Surgical History:  Procedure Laterality Date  . BACK SURGERY    . CARDIAC CATHETERIZATION  06-23-2011   left heart  . CHOLECYSTECTOMY    . LEFT HEART CATHETERIZATION WITH CORONARY  ANGIOGRAM N/A 04/07/2013   Procedure: LEFT HEART CATHETERIZATION WITH CORONARY ANGIOGRAM;  Surgeon: Vesta MixerPhilip J Nahser, MD;  [facility];  Service: Cardiovascular;  Laterality: N/A;  . TOTAL KNEE ARTHROPLASTY     bilateral    Current Medications: Outpatient Medications Prior to Visit  Medication Sig Dispense Refill  . Acetaminophen (TYLENOL ARTHRITIS EXT RELIEF PO) Take by mouth 2 (two) times daily.     . ALLOPURINOL PO Take 100 mg by mouth daily.     Marland Kitchen. amLODipine (NORVASC) 5 MG tablet Take 5 mg by mouth daily.     Marland Kitchen. atorvastatin (LIPITOR) 10 MG tablet Take 1 tablet (10 mg total) by mouth daily. 30 tablet 6  . carvedilol (COREG) 25 MG tablet Take 25 mg by mouth 2 (two) times daily with a meal.     . diltiazem (CARDIZEM CD) 180 MG 24 hr capsule Take 1 capsule (180 mg total) by mouth daily. 30 capsule 11  . GLUCOSAMINE PO Take by mouth 2 (two) times daily.     Marland Kitchen. HYDROcodone-acetaminophen (NORCO/VICODIN) 5-325 MG per tablet Take 1 tablet by mouth every 6 (six) hours as needed for moderate pain or severe pain.     Marland Kitchen. levothyroxine (SYNTHROID, LEVOTHROID) 25 MCG tablet Take 25 mcg by mouth  daily.      . losartan (COZAAR) 100 MG tablet Take 100 mg by mouth daily.      . Multiple Vitamins-Minerals (ICAPS AREDS 2 PO) Take 1 capsule by mouth 2 (two) times daily.    . nitroGLYCERIN (NITROSTAT) 0.4 MG SL tablet Place 0.4 mg under the tongue every 5 (five) minutes as needed for chest pain.    Bertram Gala Glycol-Propyl Glycol (SYSTANE OP) Place 1 drop into both eyes as needed (dry eyes).     . potassium chloride (K-DUR) 10 MEQ tablet Take 10 mEq by mouth 2 (two) times daily.    . Rivaroxaban (XARELTO) 20 MG TABS tablet Take 1 tablet (20 mg total) by mouth daily. 30 tablet 11  . Topiramate (TOPAMAX PO) Take 50 mg by mouth 2 (two) times daily.     Marland Kitchen torsemide (DEMADEX) 20 MG tablet Take 100 mg by mouth daily.    Marland Kitchen omeprazole (PRILOSEC) 20 MG capsule      No facility-administered medications prior to  visit.      Allergies:   Patient has no known allergies.   Social History   Social History  . Marital status: Married    Spouse name: N/A  . Number of children: N/A  . Years of education: N/A   Social History Main Topics  . Smoking status: Never Smoker  . Smokeless tobacco: Never Used  . Alcohol use No  . Drug use: No  . Sexual activity: Not Asked   Other Topics Concern  . None   Social History Narrative  . None     Family History:  The patient's family history includes Coronary artery disease in her mother.     ROS:   Please see the history of present illness.    ROS All other systems reviewed and are negative.   PHYSICAL EXAM:   VS:  BP 126/60   Pulse 70   Ht 5\' 2"  (1.575 m)   Wt 205 lb 1.9 oz (93 kg)   LMP  (LMP Unknown)   BMI 37.52 kg/m    GEN: Well nourished, well developed, in no acute distress, obese  HEENT: normal  Neck: no JVD, carotid bruits, or masses Cardiac: RRR; no murmurs, rubs, or gallops,no edema  Respiratory:  clear to auscultation bilaterally, normal work of breathing GI: soft, nontender, nondistended, + BS MS: no deformity or atrophy  Skin: warm and dry, no rash Neuro:  Alert and Oriented x 3, Strength and sensation are intact Psych: euthymic mood, full affect   Wt Readings from Last 3 Encounters:  11/13/16 205 lb 1.9 oz (93 kg)  08/09/16 212 lb 9.6 oz (96.4 kg)  05/08/13 194 lb (88 kg)      Studies/Labs Reviewed:   EKG:  EKG is NOT ordered today.   Recent Labs: No results found for requested labs within last 8760 hours.   Lipid Panel    Component Value Date/Time   CHOL 157 06/23/2011 0218   TRIG 136 06/23/2011 0218   HDL 46 06/23/2011 0218   CHOLHDL 3.4 06/23/2011 0218   VLDL 27 06/23/2011 0218   LDLCALC 84 06/23/2011 0218    Additional studies/ records that were reviewed today include:  2D ECHO: 06/28/16 EF 65%, normal left atrial size, mild mitral annular calcification  Cath 04/07/13: LAD 40-50% occl, EF 70%.  Medical therapy.  ASSESSMENT & PLAN:   Chronic diastolic CHF: appears euvolemic. On Toresmide 100mg  daily. This seems like a high dose. She said her nephrologist is  managing it and she had lab work at the end of the month. Will defer to them   HTN: BP well controlled today  PAF: sounds regular on exam today. Continue Xarelto 20mg  daily for CHADSVASC at least 5 (CHF, HTN, age F sex). Palpitations much improved on Diltiazem 180mg  daily.   OSA: continue CPAP  Moderate nonobstructive CAD: no ASA given OAC. No BB and statin   Medication Adjustments/Labs and Tests Ordered: Current medicines are reviewed at length with the patient today.  Concerns regarding medicines are outlined above.  Medication changes, Labs and Tests ordered today are listed in the Patient Instructions below. Patient Instructions  Medication Instructions:  Your physician recommends that you continue on your current medications as directed. Please refer to the Current Medication list given to you today.   Labwork: None ordered  Testing/Procedures: None ordered  Follow-Up: Your physician wants you to follow-up in: 9 MONTHS WITH DR. Anne Fu   You will receive a reminder letter in the mail two months in advance. If you don't receive a letter, please call our office to schedule the follow-up appointment.   Any Other Special Instructions Will Be Listed Below (If Applicable).     If you need a refill on your cardiac medications before your next appointment, please call your pharmacy.      Signed, Cline Crock, PA-C  11/13/2016 11:22 AM    [facility] [address] [place], [place], [place]  [address] Phone: [phone]; Fax: [phone]

## 2016-11-13 ENCOUNTER — Encounter (INDEPENDENT_AMBULATORY_CARE_PROVIDER_SITE_OTHER): Payer: Self-pay

## 2016-11-13 ENCOUNTER — Encounter: Payer: Self-pay | Admitting: Physician Assistant

## 2016-11-13 ENCOUNTER — Ambulatory Visit (INDEPENDENT_AMBULATORY_CARE_PROVIDER_SITE_OTHER): Payer: Medicare Other | Admitting: Physician Assistant

## 2016-11-13 VITALS — BP 126/60 | HR 70 | Ht 62.0 in | Wt 205.1 lb

## 2016-11-13 DIAGNOSIS — Z7901 Long term (current) use of anticoagulants: Secondary | ICD-10-CM | POA: Diagnosis not present

## 2016-11-13 DIAGNOSIS — I10 Essential (primary) hypertension: Secondary | ICD-10-CM

## 2016-11-13 DIAGNOSIS — G4733 Obstructive sleep apnea (adult) (pediatric): Secondary | ICD-10-CM

## 2016-11-13 DIAGNOSIS — E6609 Other obesity due to excess calories: Secondary | ICD-10-CM

## 2016-11-13 DIAGNOSIS — I480 Paroxysmal atrial fibrillation: Secondary | ICD-10-CM | POA: Diagnosis not present

## 2016-11-13 DIAGNOSIS — I5032 Chronic diastolic (congestive) heart failure: Secondary | ICD-10-CM | POA: Diagnosis not present

## 2016-11-13 NOTE — Patient Instructions (Addendum)
Medication Instructions:  Your physician recommends that you continue on your current medications as directed. Please refer to the Current Medication list given to you today.   Labwork: None ordered  Testing/Procedures: None ordered  Follow-Up: Your physician wants you to follow-up in: 9 MONTHS WITH DR. Anne FuSKAINS   You will receive a reminder letter in the mail two months in advance. If you don't receive a letter, please call our office to schedule the follow-up appointment.   Any Other Special Instructions Will Be Listed Below (If Applicable).     If you need a refill on your cardiac medications before your next appointment, please call your pharmacy.

## 2017-01-07 IMAGING — MR MR LUMBAR SPINE W/O CM
4 of 5 series · 26 of 48 positions shown · non-contrast
Comparison: MRI lumbar spine 12/21/2011.

CLINICAL DATA: Low back pain extending in the posterior left lower
extremity from the buttock to the ankle for 2 years. Left leg
weakness. Personal history of lumbar spine surgery.

EXAM:
MRI LUMBAR SPINE WITHOUT CONTRAST
TECHNIQUE: Multiplanar, multisequence MR imaging of the lumbar spine was
performed. No intravenous contrast was administered.

[Series 3: T2 · sagittal · 4.0mm · 0.55mm/px · 6 of 14 slices shown (1 of 2)]
[im 1/14]
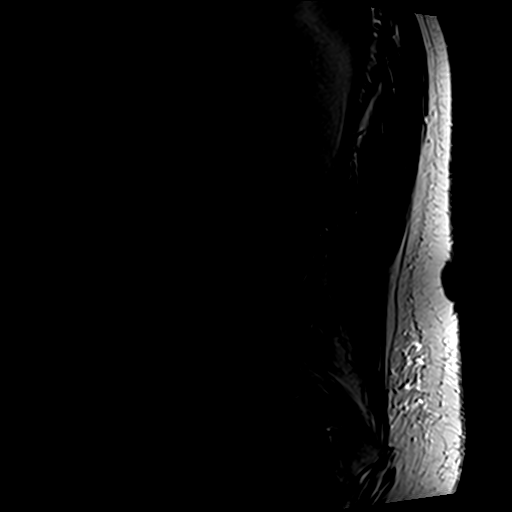
[im 3/14]
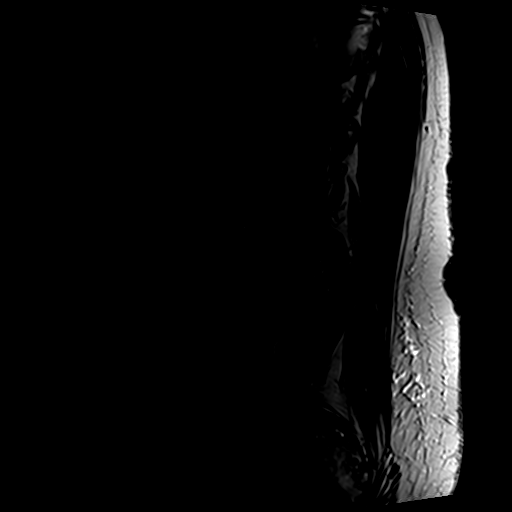
[im 6/14]
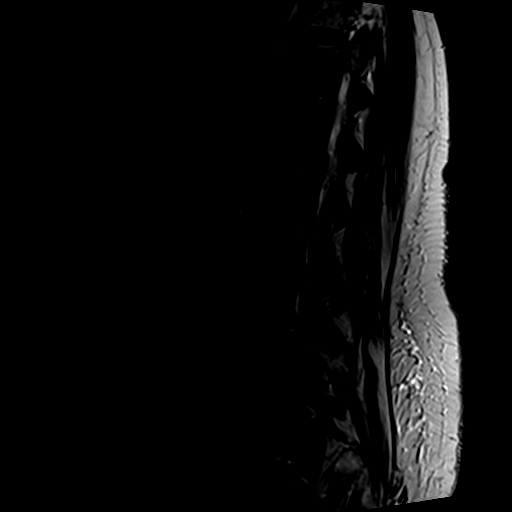
[im 8/14]
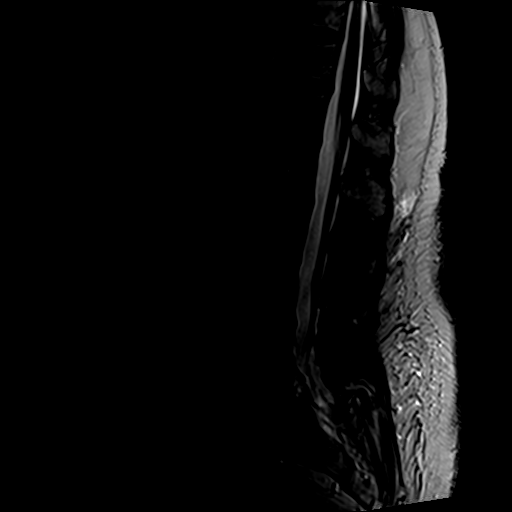
[im 11/14]
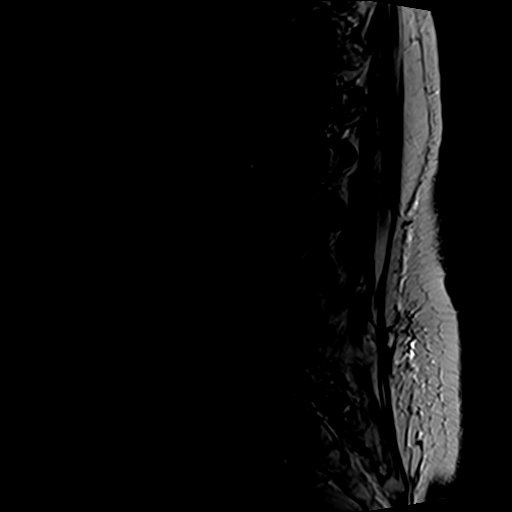
[im 14/14]
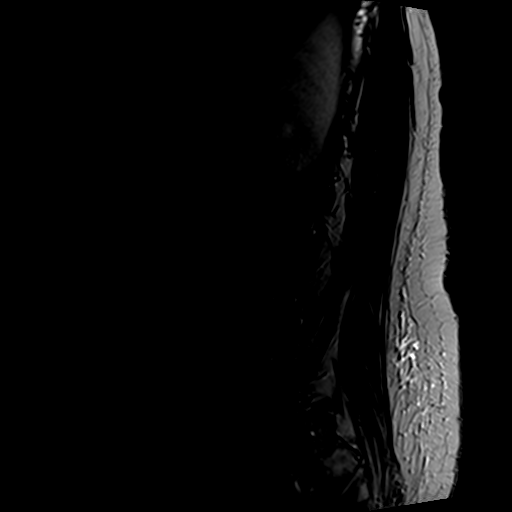

[Series 4: T1 · sagittal · 4.0mm · 0.55mm/px · 6 of 14 slices shown (1 of 2)]
[im 1/14]
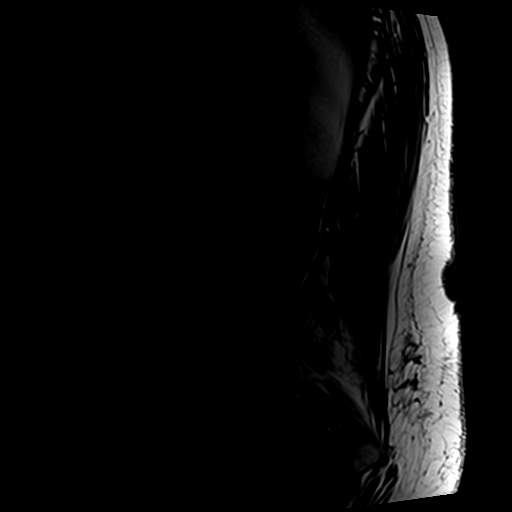
[im 3/14]
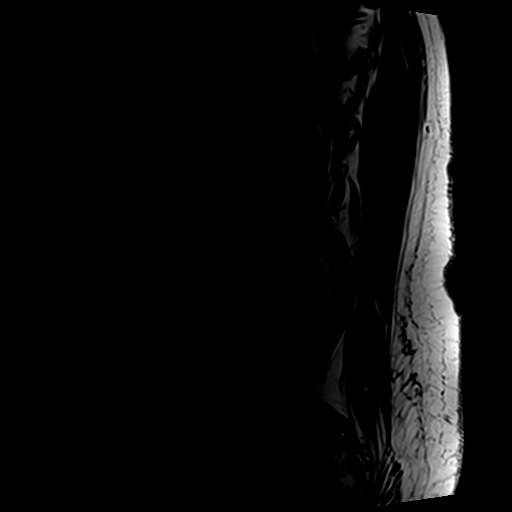
[im 6/14]
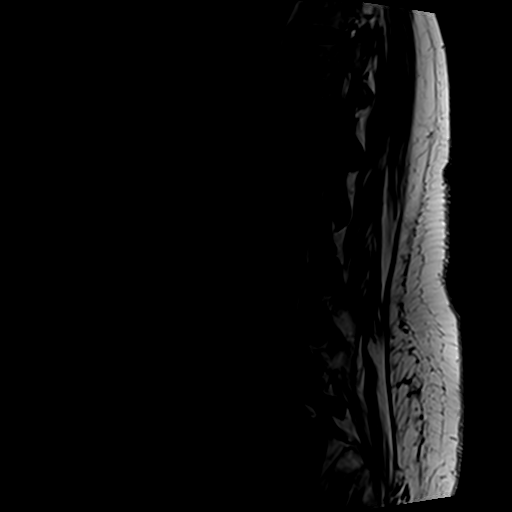
[im 8/14]
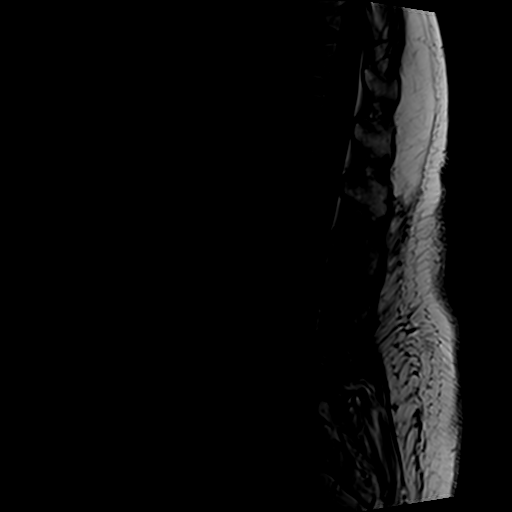
[im 11/14]
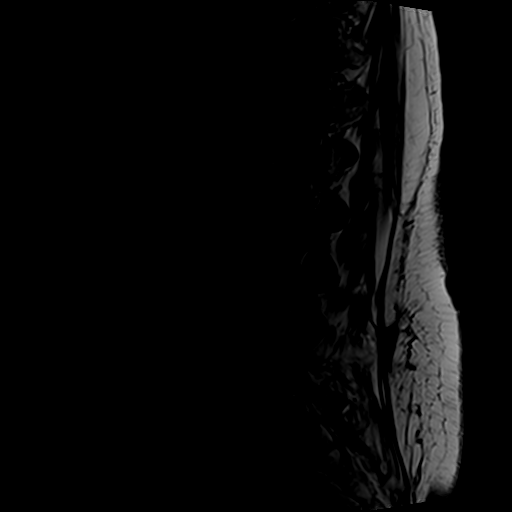
[im 14/14]
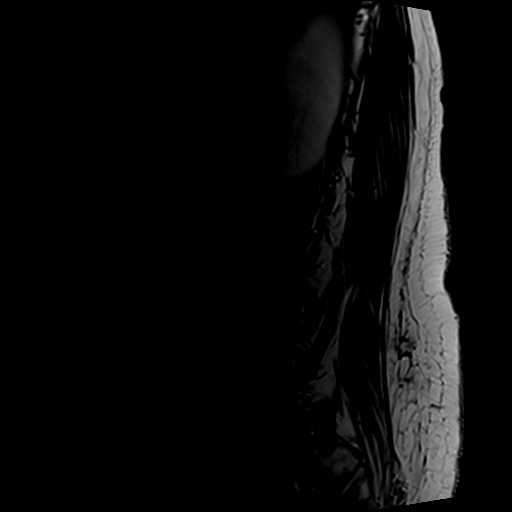

[Series 6: T2 · axial · 4.0mm · 0.78mm/px · z∈[-120,+55]mm · 9 of 34 slices shown (2 of 2)]
[im 1/34]
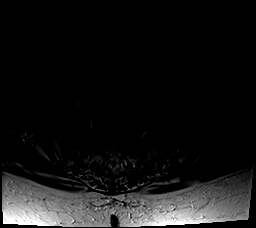
[im 5/34]
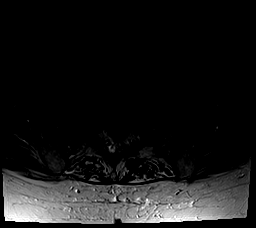
[im 10/34]
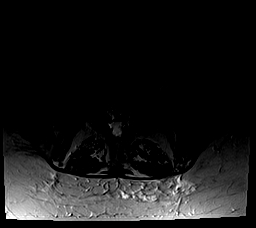
[im 15/34]
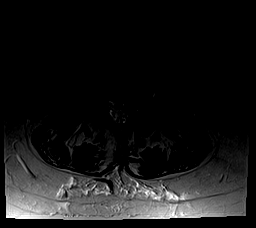
[im 17/34]
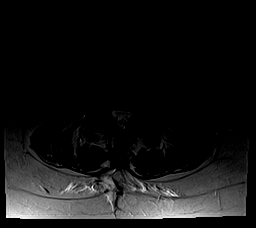
[im 19/34]
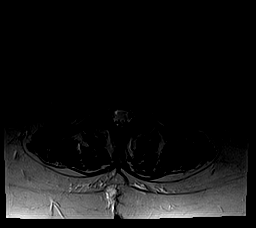
[im 24/34]
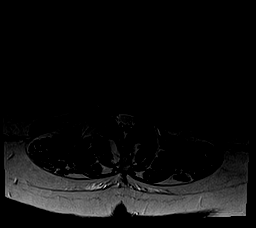
[im 29/34]
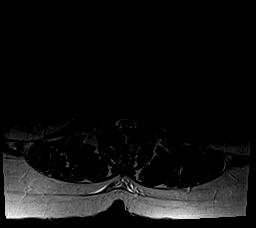
[im 34/34]
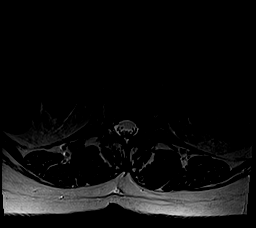

[Series 7: T1 · axial · 4.0mm · 0.35mm/px · z∈[-121,+28]mm · 5 of 34 slices shown (2 of 2)]
[im 1/34]
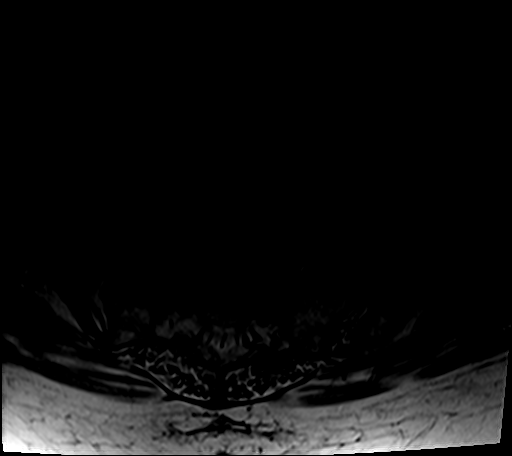
[im 5/34]
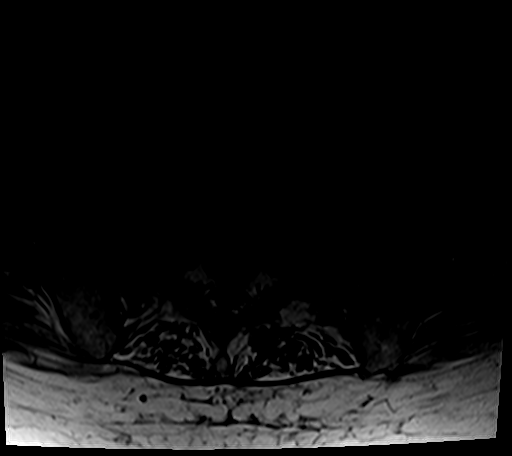
[im 10/34]
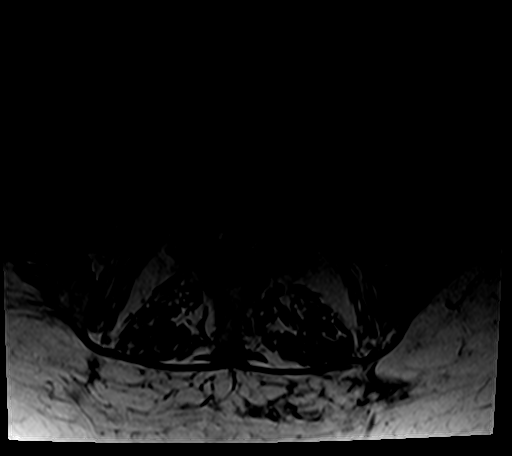
[im 17/34]
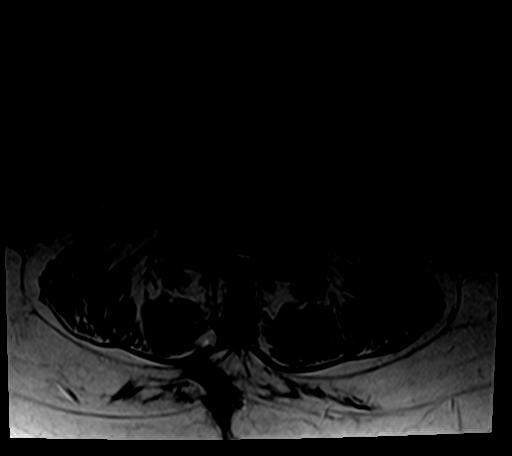
[im 29/34]
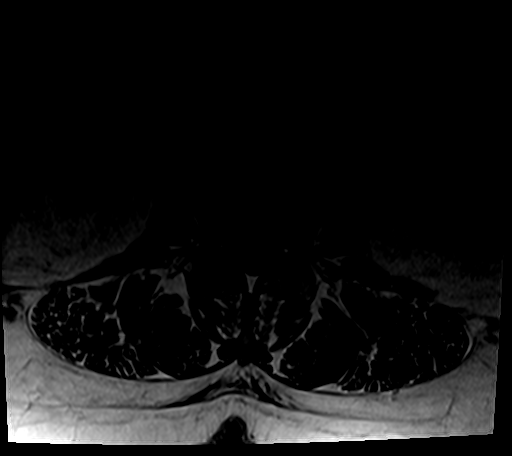

[26 of 48 positions shown; findings below may reference images not displayed]

FINDINGS: Normal signal is present in the conus medullaris which terminates at
T12. Marrow signal is within normal limits. Bilateral L5 pars
defects are again seen. There is slight retrolisthesis at L4-5 and
slight anterolisthesis at L5-S1 without significant interval change.

Limited imaging the abdomen is unremarkable.

L1-2:  Negative.

L2-3: Mild leftward disc bulging is new. There is no significant
stenosis.

L3-4: A broad-based disc protrusion has progressed. There is mild
subarticular narrowing bilaterally. Mild foraminal narrowing is
slightly worse than on the prior exam.

L4-5: A broad-based disc protrusion is present. A right laminectomy
is noted. Mild left subarticular narrowing is stable. Facet
hypertrophy contributes to the stable moderate left foraminal
stenosis.

L5-S1: Mild disc bulging and facet hypertrophy is present. Mild
foraminal narrowing is slightly worse than on the prior exam.
IMPRESSION: 1. Progression of leftward broad-based disc bulge at L3-4 without
significant stenosis.
2. Right laminectomy at L4-5 with stable persistent mild left
subarticular narrowing and moderate left foraminal stenosis.
3. Mild foraminal narrowing bilaterally at L5-S1 is slightly worse
than on the prior exam.

## 2017-08-12 ENCOUNTER — Other Ambulatory Visit: Payer: Self-pay | Admitting: Cardiology

## 2017-08-13 NOTE — Telephone Encounter (Addendum)
Xarelto 20mg  refill request received; pt is 83 yrs old, wt-93 kg old, Crea-1.60 on 06/20/17 via [facility], last seen by Bary CastillaKaty Thompson, PA on 11/13/16, CrCl-39.6311ml/min

## 2017-08-16 MED ORDER — RIVAROXABAN 15 MG PO TABS: 15.0000 mg | ORAL_TABLET | Freq: Every day | ORAL | 1 refills | Status: DC

## 2017-08-16 NOTE — Telephone Encounter (Signed)
Spoke with Dr Anne FuSkains who agrees with dose reduction down to Xarelto 150mg  daily. Pt is aware of dose change.

## 2017-09-12 ENCOUNTER — Encounter: Payer: Self-pay | Admitting: Cardiology

## 2017-09-14 ENCOUNTER — Ambulatory Visit: Payer: Medicare Other | Admitting: Cardiology

## 2017-10-30 ENCOUNTER — Encounter: Payer: Self-pay | Admitting: Cardiology

## 2017-10-30 ENCOUNTER — Encounter (INDEPENDENT_AMBULATORY_CARE_PROVIDER_SITE_OTHER): Payer: Self-pay

## 2017-10-30 ENCOUNTER — Ambulatory Visit: Payer: Medicare Other | Admitting: Cardiology

## 2017-10-30 VITALS — BP 148/76 | HR 63 | Ht 62.0 in | Wt 211.4 lb

## 2017-10-30 DIAGNOSIS — G4733 Obstructive sleep apnea (adult) (pediatric): Secondary | ICD-10-CM | POA: Diagnosis not present

## 2017-10-30 DIAGNOSIS — I10 Essential (primary) hypertension: Secondary | ICD-10-CM | POA: Diagnosis not present

## 2017-10-30 DIAGNOSIS — Z7901 Long term (current) use of anticoagulants: Secondary | ICD-10-CM

## 2017-10-30 DIAGNOSIS — I480 Paroxysmal atrial fibrillation: Secondary | ICD-10-CM

## 2017-10-30 MED ORDER — RIVAROXABAN 15 MG PO TABS: 15.0000 mg | ORAL_TABLET | Freq: Every day | ORAL | 3 refills | Status: DC

## 2017-10-30 MED ORDER — DILTIAZEM HCL ER COATED BEADS 240 MG PO CP24: 240.0000 mg | ORAL_CAPSULE | Freq: Every day | ORAL | 3 refills | Status: DC

## 2017-10-30 NOTE — Progress Notes (Signed)
Cardiology Office Note    Date:  10/30/2017   ID:  Christine, Casey 1931/12/08, MRN [id number]  PCP:  Carmin Richmond, MD  Cardiologist:   Christine Schultz, MD     History of Present Illness:  [name] is a 84 y.o. female here for follow-up of chest discomfort recently seen in the emergency department. In review of prior notes, in 2014 she was hospitalized for chest discomfort and underwent cardiac catheterization that showed nonobstructive CAD. On 04/07/13: LAD 40-50%, EF 70%. Medical therapy.  Shifts at a hospital stay for new onset atrial fibrillation. She underwent echocardiogram that showed ejection fraction 65%, normal left atrial size, mild mitral annular calcification. This was performed on 06/28/16.  Dr. Natasha Bence saw her at [facility]. She was started on diltiazem as well as Xarelto. No symptoms.  Currently she will occasionally have rapid heart rate at night around 2 AM that sometimes wakes her up. This did not happen much for possibly 2 weeks after hospitalization and starting diltiazem. She is noticing this more.  EKG on 12/10/15 shows sinus rhythm with PACs.  LDL 72, TSH normal at 2.61, ALT 19, creatinine 0.84, potassium 4.4, hemoglobin 13.7.  10/30/17 - will feel some SSCP in bed. Not really pain. Hurt all the time, this does not occur with exertion.  This is likely noncardiac, musculoskeletal or perhaps GERD-like pain. Renal MD increased dilt 240 and Demadex.  No recurrence of palpitations.  No bleeding issues.  Occasionally will have a minor nosebleed once a month especially in the wintertime.  Past Medical History:  Diagnosis Date  . CAD (coronary artery disease)    a. Cath 06/23/11.  LAD 50 - 60% ostial, EF 65%;  b. 04/2013 Cath: LM nl, LAD 40-50 ost, LCX nl, OM1/2 nl, RCA/PDA/PLSA nl, EF 70%.  . DJD (degenerative joint disease)   . DM (diabetes mellitus) (HCC)   . Dyspnea    chronic  . GERD (gastroesophageal reflux disease)   . Gout   . HLD  (hyperlipidemia)   . HTN (hypertension)   . Obesity   . OSA on CPAP     Past Surgical History:  Procedure Laterality Date  . BACK SURGERY    . CARDIAC CATHETERIZATION  06-23-2011   left heart  . CHOLECYSTECTOMY    . LEFT HEART CATHETERIZATION WITH CORONARY ANGIOGRAM N/A 04/07/2013   Procedure: LEFT HEART CATHETERIZATION WITH CORONARY ANGIOGRAM;  Surgeon: Vesta Mixer, MD;  Location: [facility] CATH LAB;  Service: Cardiovascular;  Laterality: N/A;  . TOTAL KNEE ARTHROPLASTY     bilateral    Current Medications: Outpatient Medications Prior to Visit  Medication Sig Dispense Refill  . Acetaminophen (TYLENOL ARTHRITIS EXT RELIEF PO) Take by mouth 2 (two) times daily.     . ALLOPURINOL PO Take 100 mg by mouth daily.     Marland Kitchen amLODipine (NORVASC) 5 MG tablet Take 5 mg by mouth daily.     Marland Kitchen atorvastatin (LIPITOR) 10 MG tablet Take 1 tablet (10 mg total) by mouth daily. 30 tablet 6  . carvedilol (COREG) 25 MG tablet Take 25 mg by mouth 2 (two) times daily with a meal.     . GLUCOSAMINE PO Take by mouth 2 (two) times daily.     Marland Kitchen HYDROcodone-acetaminophen (NORCO/VICODIN) 5-325 MG per tablet Take 1 tablet by mouth every 6 (six) hours as needed for moderate pain or severe pain.     Marland Kitchen levothyroxine (SYNTHROID, LEVOTHROID) 25 MCG tablet Take 25 mcg  by mouth daily.      Marland Kitchen losartan (COZAAR) 100 MG tablet Take 100 mg by mouth daily.      . Multiple Vitamins-Minerals (ICAPS AREDS 2 PO) Take 1 capsule by mouth 2 (two) times daily.    . nitroGLYCERIN (NITROSTAT) 0.4 MG SL tablet Place 0.4 mg under the tongue every 5 (five) minutes as needed for chest pain.    Bertram Gala Glycol-Propyl Glycol (SYSTANE OP) Place 1 drop into both eyes as needed (dry eyes).     . potassium chloride (K-DUR) 10 MEQ tablet Take 10 mEq by mouth 2 (two) times daily.    . Topiramate (TOPAMAX PO) Take 50 mg by mouth 2 (two) times daily.     Marland Kitchen torsemide (DEMADEX) 20 MG tablet Take 100 mg by mouth daily.    Marland Kitchen diltiazem (CARDIZEM CD) 180 MG  24 hr capsule Take 1 capsule (180 mg total) daily by mouth. (Patient taking differently: Take 240 mg by mouth daily. ) 90 capsule 0  . Rivaroxaban (XARELTO) 15 MG TABS tablet Take 1 tablet (15 mg total) daily with supper by mouth. 90 tablet 1   No facility-administered medications prior to visit.       Allergies:   Patient has no known allergies.   Social History   Socioeconomic History  . Marital status: Married    Spouse name: Not on file  . Number of children: Not on file  . Years of education: Not on file  . Highest education level: Not on file  Social Needs  . Financial resource strain: Not on file  . Food insecurity - worry: Not on file  . Food insecurity - inability: Not on file  . Transportation needs - medical: Not on file  . Transportation needs - non-medical: Not on file  Occupational History  . Not on file  Tobacco Use  . Smoking status: Never Smoker  . Smokeless tobacco: Never Used  Substance and Sexual Activity  . Alcohol use: No  . Drug use: No  . Sexual activity: Not on file  Other Topics Concern  . Not on file  Social History Narrative  . Not on file     Family History:  The patient's family history includes Coronary artery disease in her mother.   ROS:   Please see the history of present illness.    Review of Systems  All other systems reviewed and are negative.     PHYSICAL EXAM:   VS:  BP (!) 148/76   Pulse 63   Ht 5\' 2"  (1.575 m)   Wt 211 lb 6.4 oz (95.9 kg)   LMP  (LMP Unknown)   BMI 38.67 kg/m    GEN: Well nourished, well developed, in no acute distress obese HEENT: normal  Neck: no JVD, carotid bruits, or masses Cardiac: RRR; no murmurs, rubs, or gallops,no edema  Respiratory:  clear to auscultation bilaterally, normal work of breathing GI: soft, nontender, nondistended, + BS MS: no deformity or atrophy  Skin: warm and dry, no rash Neuro:  Alert and Oriented x 3, Strength and sensation are intact Psych: euthymic mood, full  affect   Wt Readings from Last 3 Encounters:  10/30/17 211 lb 6.4 oz (95.9 kg)  11/13/16 205 lb 1.9 oz (93 kg)  08/09/16 212 lb 9.6 oz (96.4 kg)      Studies/Labs Reviewed:   EKG:  EKG is ordered today.  The ekg ordered today 10/30/17-sinus rhythm 63 poor R wave progression personally viewed, prior 08/09/16-sinus  rhythm, poor R-wave progression heart rate 64 bpm personally viewed.  Recent Labs: No results found for requested labs within last 8760 hours.   Lipid Panel    Component Value Date/Time   CHOL 157 06/23/2011 0218   TRIG 136 06/23/2011 0218   HDL 46 06/23/2011 0218   CHOLHDL 3.4 06/23/2011 0218   VLDL 27 06/23/2011 0218   LDLCALC 84 06/23/2011 0218    Additional studies/ records that were reviewed today include:  Prior office notes, EKG, echocardiogram reviewed.    ASSESSMENT:    1. Paroxysmal atrial fibrillation (HCC)   2. Essential hypertension   3. Chronic anticoagulation   4. OSA (obstructive sleep apnea)      PLAN:  In order of problems listed above:  Paroxysmal atrial fibrillation  -Overall doing very well.  No palpitations, no known recurrence of atrial fibrillation.  previously diagnosed in the hospital, here echocardiogram reassuring. She does have moderate nonobstructive CAD of her LAD 40-50%.   Note, she is already taking carvedilol at max dose. Current heart rate 64 bpm. Dilt 240 QD.   Chronic anticoagulation  - Continue with Xarelto dose adjusted for renal function. 15mg . If renal function GFR >50 consistently, she may be able to increase dose to 20mg .   Obstructive sleep apnea  - Continue with CPAP. No change. Risk factor for AFIB  Obesity  - Continue to encourage weight loss. Also RF for AFIB  Essential hypertension  - Mildly elevated. Multidrug regiment reviewed. Demadex as well. Renal following.   Blood work has been followed by Dr. Earlene Plateravis her primary doctor.  Medication Adjustments/Labs and Tests Ordered: Current medicines are  reviewed at length with the patient today.  Concerns regarding medicines are outlined above.  Medication changes, Labs and Tests ordered today are listed in the Patient Instructions below. Patient Instructions  Medication Instructions:  The current medical regimen is effective;  continue present plan and medications.  Labwork: None  Testing/Procedures: None  Follow-Up: Follow up in 1 year with Dr. Anne FuSkains.  You will receive a letter in the mail 2 months before you are due.  Please call us when you receive this letter to schedule your follow up appointment.  If you need a refill on your cardiac medications before your next appointment, please call your pharmacy.  Thank you for choosing [facility]!!        Signed, Christine SchultzMark Massa Pe, MD  10/30/2017 9:12 AM    [facility] [address], [place], [place]  [address] Phone: [phone]) [phone]; Fax: [phone]) [phone]

## 2017-10-30 NOTE — Patient Instructions (Signed)
Medication Instructions:  The current medical regimen is effective;  continue present plan and medications.  Labwork: None   Testing/Procedures: None  Follow-Up: Follow up in 1 year with Dr. Skains.  You will receive a letter in the mail 2 months before you are due.  Please call us when you receive this letter to schedule your follow up appointment.  If you need a refill on your cardiac medications before your next appointment, please call your pharmacy.  Thank you for choosing [facility]!!     

## 2018-02-15 ENCOUNTER — Telehealth: Payer: Self-pay | Admitting: *Deleted

## 2018-02-15 NOTE — Telephone Encounter (Signed)
   [facility] Pre-operative Risk Assessment    Request for surgical clearance:  1. What type of surgery is being performed? LUMBAR EPIDURAL INJECTION   2. When is this surgery scheduled? 02/26/18   3. Are there any medications that need to be held prior to surgery and how long? XARELTO   4. Practice name and name of physician performing surgery? [facility] ; DR. Ellene Route   5. What is your office phone and fax number? PH# [phone]; FAX # [phone]   0. Anesthesia type (None, local, MAC, general) ? VERSED    [name] 02/15/2018, 2:16 PM  _________________________________________________________________   (provider comments below)

## 2018-02-18 NOTE — Telephone Encounter (Signed)
Will route to pharmacy. The pt needs call to clear.

## 2018-02-19 NOTE — Telephone Encounter (Signed)
   Primary Cardiologist: Donato Schultz, MD  Chart reviewed as part of pre-operative protocol coverage. Patient was contacted 02/19/2018 in reference to pre-operative risk assessment for pending surgery as outlined below.  [name] was last seen on 10/30/17 by Dr. Anne Fu.  Since that day, CHIQUITTA MATTY has done well with no new cardiac complaints. .  Therefore, based on ACC/AHA guidelines, the patient would be at acceptable risk for the planned procedure without further cardiovascular testing.   Patient with diagnosis of Afib on Xarelto for anticoagulation.    Procedure: Lumbar epidural Date of procedure: 02/26/18  CHADS2-VASc score of  6 (CHF, HTN, AGE, DM2, stroke/tia x 2, CAD, AGE, female) CrCl 45ml/min  Per office protocol, patient can hold Xarelto for 3 days prior to procedure.    I will route this recommendation to the requesting party via Epic fax function and remove from pre-op pool.  Please call with questions.  Berton Bon, NP 02/19/2018, 5:03 PM

## 2018-02-19 NOTE — Telephone Encounter (Signed)
Patient with diagnosis of Afib on Xarelto for anticoagulation.    Procedure: Lumbar epidural Date of procedure: 02/26/18  CHADS2-VASc score of  6 (CHF, HTN, AGE, DM2, stroke/tia x 2, CAD, AGE, female)  CrCl 46ml/min  Per office protocol, patient can hold Xarelto for 3 days prior to procedure.

## 2018-06-07 ENCOUNTER — Encounter: Payer: Self-pay | Admitting: *Deleted

## 2018-06-07 ENCOUNTER — Ambulatory Visit: Payer: Medicare Other | Admitting: Cardiology

## 2018-06-07 ENCOUNTER — Encounter: Payer: Self-pay | Admitting: Cardiology

## 2018-06-07 VITALS — BP 128/74 | HR 65 | Ht 62.0 in | Wt 208.8 lb

## 2018-06-07 DIAGNOSIS — I480 Paroxysmal atrial fibrillation: Secondary | ICD-10-CM

## 2018-06-07 DIAGNOSIS — R079 Chest pain, unspecified: Secondary | ICD-10-CM | POA: Diagnosis not present

## 2018-06-07 DIAGNOSIS — I10 Essential (primary) hypertension: Secondary | ICD-10-CM | POA: Diagnosis not present

## 2018-06-07 DIAGNOSIS — Z7901 Long term (current) use of anticoagulants: Secondary | ICD-10-CM

## 2018-06-07 NOTE — Patient Instructions (Addendum)
Medication Instructions:  The current medical regimen is effective;  continue present plan and medications.  Testing/Procedures: Your physician has requested that you have a lexiscan myoview. For further information please visit [email or web address]. Please follow instruction sheet, as given.  Follow-Up: Follow up in 1 year with Dr. Skains.  You will receive a letter in the mail 2 months before you are due.  Please call us when you receive this letter to schedule your follow up appointment.  If you need a refill on your cardiac medications before your next appointment, please call your pharmacy.  Thank you for choosing [facility]!!     

## 2018-06-07 NOTE — Progress Notes (Signed)
Cardiology Office Note    Date:  06/07/2018   ID:  [name], DOB January 18, 1932, MRN [id number]  PCP:  Carmin Richmond, MD  Cardiologist:   Donato Schultz, MD     History of Present Illness:  [name] is a 84 y.o. female here for follow-up of chest discomfort recently seen in the emergency department. In review of prior notes, in 2014 she was hospitalized for chest discomfort and underwent cardiac catheterization that showed nonobstructive CAD. On 04/07/13: LAD 40-50%, EF 70%. Medical therapy.  Shifts at a hospital stay for new onset atrial fibrillation. She underwent echocardiogram that showed ejection fraction 65%, normal left atrial size, mild mitral annular calcification. This was performed on 06/28/16.  Dr. Natasha Bence saw her at [facility]. She was started on diltiazem as well as Xarelto. No symptoms.  Currently she will occasionally have rapid heart rate at night around 2 AM that sometimes wakes her up. This did not happen much for possibly 2 weeks after hospitalization and starting diltiazem. She is noticing this more.  EKG on 12/10/15 shows sinus rhythm with PACs.  LDL 72, TSH normal at 2.61, ALT 19, creatinine 0.84, potassium 4.4, hemoglobin 13.7.  10/30/17 - will feel some SSCP in bed. Not really pain. Hurt all the time, this does not occur with exertion.  This is likely noncardiac, musculoskeletal or perhaps GERD-like pain. Renal MD increased dilt 240 and Demadex.  No recurrence of palpitations.  No bleeding issues.  Occasionally will have a minor nosebleed once a month especially in the wintertime.  06/07/2018- she is here today at the request of Dr. Lois Huxley because she is recently in the emergency room with moderate severity chest tightness x3 days.  She went to the [facility] emergency department on 05/21/2018.  Minimal pitting edema noted, EKG showed sinus rhythm without any pathology.  TSH was normal troponin was normal.  Sodium 132 creatinine 1.2.  She was asked  to follow-up with her family physician.  Just went away on own.  She denies any fevers chills nausea vomiting syncope bleeding.  She remembers back in 2012 having a dobutamine echocardiogram stress test and she said the experience was awful.  She felt like her head was going to come off.  I reassured her that we would not repeat this test.   Past Medical History:  Diagnosis Date  . CAD (coronary artery disease)    a. Cath 06/23/11.  LAD 50 - 60% ostial, EF 65%;  b. 04/2013 Cath: LM nl, LAD 40-50 ost, LCX nl, OM1/2 nl, RCA/PDA/PLSA nl, EF 70%.  . DJD (degenerative joint disease)   . DM (diabetes mellitus) (HCC)   . Dyspnea    chronic  . GERD (gastroesophageal reflux disease)   . Gout   . HLD (hyperlipidemia)   . HTN (hypertension)   . Obesity   . OSA on CPAP     Past Surgical History:  Procedure Laterality Date  . BACK SURGERY    . CARDIAC CATHETERIZATION  06-23-2011   left heart  . CHOLECYSTECTOMY    . LEFT HEART CATHETERIZATION WITH CORONARY ANGIOGRAM N/A 04/07/2013   Procedure: LEFT HEART CATHETERIZATION WITH CORONARY ANGIOGRAM;  Surgeon: Vesta Mixer, MD;  Location: [facility] CATH LAB;  Service: Cardiovascular;  Laterality: N/A;  . TOTAL KNEE ARTHROPLASTY     bilateral    Current Medications: Outpatient Medications Prior to Visit  Medication Sig Dispense Refill  . Acetaminophen (TYLENOL ARTHRITIS EXT RELIEF PO) Take by mouth  2 (two) times daily.     Marland Kitchen allopurinol (ZYLOPRIM) 100 MG tablet Take 1 tablet by mouth daily.    Marland Kitchen atorvastatin (LIPITOR) 10 MG tablet Take 1 tablet (10 mg total) by mouth daily. 30 tablet 6  . carvedilol (COREG) 25 MG tablet Take 25 mg by mouth 2 (two) times daily with a meal.     . diltiazem (CARDIZEM CD) 240 MG 24 hr capsule Take 1 capsule (240 mg total) by mouth daily. 90 capsule 3  . GLUCOSAMINE PO Take by mouth 2 (two) times daily.     Marland Kitchen HYDROcodone-acetaminophen (NORCO/VICODIN) 5-325 MG per tablet Take 1 tablet by mouth every 6 (six) hours as needed  for moderate pain or severe pain.     Marland Kitchen levothyroxine (SYNTHROID, LEVOTHROID) 125 MCG tablet Take 1 tablet by mouth daily.    Marland Kitchen losartan (COZAAR) 100 MG tablet Take 100 mg by mouth daily.      . metFORMIN (GLUCOPHAGE) 500 MG tablet Take 1 tablet by mouth daily.    . Multiple Vitamins-Minerals (ICAPS AREDS 2 PO) Take 1 capsule by mouth 2 (two) times daily.    . nitroGLYCERIN (NITROSTAT) 0.4 MG SL tablet Place 0.4 mg under the tongue every 5 (five) minutes as needed for chest pain.    . potassium chloride SA (K-DUR,KLOR-CON) 20 MEQ tablet Take 1 tablet by mouth 4 (four) times daily.    . ranitidine (ZANTAC) 150 MG tablet Take 1 tablet by mouth 2 (two) times daily.    . Rivaroxaban (XARELTO) 15 MG TABS tablet Take 1 tablet (15 mg total) by mouth daily with supper. 90 tablet 3  . topiramate (TOPAMAX) 50 MG tablet Take 1 tablet by mouth 4 (four) times daily.    Marland Kitchen torsemide (DEMADEX) 20 MG tablet Take 100 mg by mouth daily.    . potassium chloride (K-DUR) 10 MEQ tablet Take 10 mEq by mouth 2 (two) times daily.    . ALLOPURINOL PO Take 100 mg by mouth daily.     Marland Kitchen amLODipine (NORVASC) 5 MG tablet Take 5 mg by mouth daily.     Marland Kitchen levothyroxine (SYNTHROID, LEVOTHROID) 25 MCG tablet Take 25 mcg by mouth daily.      Bertram Gala Glycol-Propyl Glycol (SYSTANE OP) Place 1 drop into both eyes as needed (dry eyes).     . Topiramate (TOPAMAX PO) Take 50 mg by mouth 2 (two) times daily.      No facility-administered medications prior to visit.       Allergies:   Patient has no known allergies.   Social History   Socioeconomic History  . Marital status: Married    Spouse name: Not on file  . Number of children: Not on file  . Years of education: Not on file  . Highest education level: Not on file  Occupational History  . Not on file  Social Needs  . Financial resource strain: Not on file  . Food insecurity:    Worry: Not on file    Inability: Not on file  . Transportation needs:    Medical: Not  on file    Non-medical: Not on file  Tobacco Use  . Smoking status: Never Smoker  . Smokeless tobacco: Never Used  Substance and Sexual Activity  . Alcohol use: No  . Drug use: No  . Sexual activity: Not on file  Lifestyle  . Physical activity:    Days per week: Not on file    Minutes per session: Not on file  .  Stress: Not on file  Relationships  . Social connections:    Talks on phone: Not on file    Gets together: Not on file    Attends religious service: Not on file    Active member of club or organization: Not on file    Attends meetings of clubs or organizations: Not on file    Relationship status: Not on file  Other Topics Concern  . Not on file  Social History Narrative  . Not on file     Family History:  The patient's family history includes Coronary artery disease in her mother.   ROS:   Please see the history of present illness.    Review of Systems  All other systems reviewed and are negative.     PHYSICAL EXAM:   VS:  BP 128/74   Pulse 65   Ht 5\' 2"  (1.575 m)   Wt 208 lb 12.8 oz (94.7 kg)   LMP  (LMP Unknown)   SpO2 96%   BMI 38.19 kg/m    GEN: Well nourished, well developed, in no acute distress obese HEENT: normal  Neck: no JVD, carotid bruits, or masses Cardiac: RRR; no murmurs, rubs, or gallops,no edema  Respiratory:  clear to auscultation bilaterally, normal work of breathing GI: soft, nontender, nondistended, + BS MS: no deformity or atrophy  Skin: warm and dry, no rash Neuro:  Alert and Oriented x 3, Strength and sensation are intact Psych: euthymic mood, full affect   Wt Readings from Last 3 Encounters:  06/07/18 208 lb 12.8 oz (94.7 kg)  10/30/17 211 lb 6.4 oz (95.9 kg)  11/13/16 205 lb 1.9 oz (93 kg)      Studies/Labs Reviewed:   EKG:  EKG is ordered today.  The ekg ordered today 06/07/2018-sinus rhythm with marked sinus arrhythmia heart rate 65, borderline LVH personally viewed-prior 10/30/17-sinus rhythm 63 poor R wave  progression personally viewed, prior 08/09/16-sinus rhythm, poor R-wave progression heart rate 64 bpm personally viewed.  Recent Labs: No results found for requested labs within last 8760 hours.   Lipid Panel    Component Value Date/Time   CHOL 157 06/23/2011 0218   TRIG 136 06/23/2011 0218   HDL 46 06/23/2011 0218   CHOLHDL 3.4 06/23/2011 0218   VLDL 27 06/23/2011 0218   LDLCALC 84 06/23/2011 0218    Additional studies/ records that were reviewed today include:  Prior office notes, EKG, echocardiogram reviewed.    ASSESSMENT:    1. Chest pain, unspecified type   2. Essential hypertension   3. Paroxysmal atrial fibrillation (HCC)   4. Morbid obesity (HCC)   5. Chronic anticoagulation      PLAN:  In order of problems listed above:  Paroxysmal atrial fibrillation  -Overall doing very well.  No palpitations, no known recurrence of atrial fibrillation.  previously diagnosed in the hospital, here echocardiogram reassuring.   Chest pain/moderate CAD - I would like to go ahead and get her set up for a pharmacologic stress test, nuclear, Lexiscan.  She had a bad experience previously with dobutamine.  We will avoid. She does have moderate nonobstructive CAD of her LAD 40-50% cardiac catheterization in 2014.   Note, she is already taking carvedilol at max dose. Current heart rate 64 bpm. Dilt 240 QD.  Her chest pain was quite atypical.  Chronic anticoagulation  - Continue with Xarelto dose adjusted for renal function. 15mg . If renal function GFR >50 consistently, she may be able to increase dose to 20mg .  No changes made.  Lab work recently performed and unremarkable.  Creatinine 1.2-1.5, creatinine clearance 42-32.  Hemoglobin 12.1.  Obstructive sleep apnea  - Continue with CPAP. No change. Risk factor for AFIB.  Continue to treat  Morbid obesity - Also RF for AFIB.  BMI greater than 35 with 2 or more comorbidities.  Continue encourage weight loss.  Essential hypertension  -  Mildly elevated. Multidrug regiment reviewed. Demadex as well. Renal following.  No changes made.  Blood work has been followed by Dr. Earlene Plater her primary doctor.  Also personally reviewed lab work from care everywhere  Medication Adjustments/Labs and Tests Ordered: Current medicines are reviewed at length with the patient today.  Concerns regarding medicines are outlined above.  Medication changes, Labs and Tests ordered today are listed in the Patient Instructions below. Patient Instructions  Medication Instructions:  The current medical regimen is effective;  continue present plan and medications.  Testing/Procedures: Your physician has requested that you have a lexiscan myoview. For further information please visit [email or web address] Please follow instruction sheet, as given.  Follow-Up: Follow up in 1 year with Dr. Anne Fu.  You will receive a letter in the mail 2 months before you are due.  Please call us when you receive this letter to schedule your follow up appointment.  If you need a refill on your cardiac medications before your next appointment, please call your pharmacy.  Thank you for choosing [facility]!!        Signed, Donato Schultz, MD  06/07/2018 11:16 AM    [facility] [address] [place], [place], [place]  [address] Phone: [phone]; Fax: [phone]

## 2018-06-11 ENCOUNTER — Telehealth: Payer: Self-pay | Admitting: *Deleted

## 2018-06-11 NOTE — Telephone Encounter (Signed)
Patient given detailed instructions per Myocardial Perfusion Study Information Sheet for the test on 06/17/18 at 0730. Patient notified to arrive 15 minutes early and that it is imperative to arrive on time for appointment to keep from having the test rescheduled.  If you need to cancel or reschedule your appointment, please call the office within 24 hours of your appointment. . Patient verbalized understanding.[name], [name]

## 2018-06-17 ENCOUNTER — Ambulatory Visit (HOSPITAL_COMMUNITY): Payer: Medicare Other | Attending: Cardiology

## 2018-06-17 DIAGNOSIS — R079 Chest pain, unspecified: Secondary | ICD-10-CM | POA: Diagnosis not present

## 2018-06-17 DIAGNOSIS — I10 Essential (primary) hypertension: Secondary | ICD-10-CM | POA: Diagnosis not present

## 2018-06-17 LAB — MYOCARDIAL PERFUSION IMAGING
CHL CUP NUCLEAR SRS: 11
CHL CUP NUCLEAR SSS: 15
CSEPPHR: 86 {beats}/min
LV dias vol: 79 mL (ref 46–106)
LV sys vol: 17 mL
RATE: 0.27
Rest HR: 63 {beats}/min
SDS: 4
TID: 1.2

## 2018-06-17 MED ORDER — REGADENOSON 0.4 MG/5ML IV SOLN
0.4000 mg | Freq: Once | INTRAVENOUS | Status: AC
  Administered 2018-06-17: 0.4 mg via INTRAVENOUS

## 2018-06-17 MED ORDER — TECHNETIUM TC 99M TETROFOSMIN IV KIT
32.3000 | PACK | Freq: Once | INTRAVENOUS | Status: AC | PRN
  Administered 2018-06-17: 32.3 via INTRAVENOUS
  Filled 2018-06-17: qty 33

## 2018-06-17 MED ORDER — TECHNETIUM TC 99M TETROFOSMIN IV KIT
10.2000 | PACK | Freq: Once | INTRAVENOUS | Status: AC | PRN
  Administered 2018-06-17: 10.2 via INTRAVENOUS
  Filled 2018-06-17: qty 11

## 2018-06-18 ENCOUNTER — Ambulatory Visit: Payer: Medicare Other | Admitting: Cardiology

## 2018-06-18 ENCOUNTER — Encounter: Payer: Self-pay | Admitting: *Deleted

## 2018-06-18 ENCOUNTER — Encounter: Payer: Self-pay | Admitting: Cardiology

## 2018-06-18 VITALS — BP 160/74 | HR 84 | Ht 62.0 in | Wt 211.1 lb

## 2018-06-18 DIAGNOSIS — I209 Angina pectoris, unspecified: Secondary | ICD-10-CM

## 2018-06-18 DIAGNOSIS — R9439 Abnormal result of other cardiovascular function study: Secondary | ICD-10-CM

## 2018-06-18 DIAGNOSIS — R079 Chest pain, unspecified: Secondary | ICD-10-CM | POA: Diagnosis not present

## 2018-06-18 DIAGNOSIS — Z01812 Encounter for preprocedural laboratory examination: Secondary | ICD-10-CM | POA: Diagnosis not present

## 2018-06-18 DIAGNOSIS — Z7901 Long term (current) use of anticoagulants: Secondary | ICD-10-CM

## 2018-06-18 DIAGNOSIS — I480 Paroxysmal atrial fibrillation: Secondary | ICD-10-CM

## 2018-06-18 NOTE — Progress Notes (Signed)
Cardiology Office Note:    Date:  06/18/2018   ID:  Christine BoyerSylvia F Trahan, DOB 1932/07/26, MRN [id number]  PCP:  Carmin Richmondavis, James W, MD  Cardiologist:  Donato SchultzMark Skains, MD  Electrophysiologist:  None   Referring MD: Carmin Richmondavis, James W, MD     History of Present Illness:    [name] is a 84 y.o. female here to discuss abnormal stress test, possible cardiac catheterization.  Last catheterization was performed in July 2014 showing 50% ostial LAD, normal EF.  She underwent nuclear stress test on 06/17/2018 which showed evidence of high risk ischemia in the anterior distribution, possible LAD. There is a large size, mild severity, reversible defect consistent with ischemia in the LAD territory (SDS =6). A cardiac catheterization is recommended.  She was seen on 06/07/2018 with chest tightness for 3 days after going to the emergency room for this episode in [place].  EKG was unremarkable, troponin was normal, creatinine 1.2.  Chest pain was described as moderate in severity, intermittent.  She had some more chest tightness this week when going upstairs, fatigue.  Suffers from chronic lower extremity edema as well.    Past Medical History:  Diagnosis Date  . CAD (coronary artery disease)    a. Cath 06/23/11.  LAD 50 - 60% ostial, EF 65%;  b. 04/2013 Cath: LM nl, LAD 40-50 ost, LCX nl, OM1/2 nl, RCA/PDA/PLSA nl, EF 70%.  . DJD (degenerative joint disease)   . DM (diabetes mellitus) (HCC)   . Dyspnea    chronic  . GERD (gastroesophageal reflux disease)   . Gout   . HLD (hyperlipidemia)   . HTN (hypertension)   . Obesity   . OSA on CPAP     Past Surgical History:  Procedure Laterality Date  . BACK SURGERY    . CARDIAC CATHETERIZATION  06-23-2011   left heart  . CHOLECYSTECTOMY    . LEFT HEART CATHETERIZATION WITH CORONARY ANGIOGRAM N/A 04/07/2013   Procedure: LEFT HEART CATHETERIZATION WITH CORONARY ANGIOGRAM;  Surgeon: Vesta MixerPhilip J Nahser, MD;  [facility];  Service: Cardiovascular;   Laterality: N/A;  . TOTAL KNEE ARTHROPLASTY     bilateral    Current Medications: Current Meds  Medication Sig  . Acetaminophen (TYLENOL ARTHRITIS EXT RELIEF PO) Take by mouth 2 (two) times daily.   Marland Kitchen. allopurinol (ZYLOPRIM) 100 MG tablet Take 1 tablet by mouth daily.  Marland Kitchen. atorvastatin (LIPITOR) 10 MG tablet Take 1 tablet (10 mg total) by mouth daily.  . carvedilol (COREG) 25 MG tablet Take 25 mg by mouth 2 (two) times daily with a meal.   . diltiazem (CARDIZEM CD) 240 MG 24 hr capsule Take 1 capsule (240 mg total) by mouth daily.  Marland Kitchen. glucosamine-chondroitin 500-400 MG tablet Take 1 tablet by mouth 2 (two) times daily.  Marland Kitchen. levothyroxine (SYNTHROID, LEVOTHROID) 125 MCG tablet Take 1 tablet by mouth daily.  Marland Kitchen. losartan (COZAAR) 100 MG tablet Take 100 mg by mouth daily.    . metFORMIN (GLUCOPHAGE) 500 MG tablet Take 1 tablet by mouth daily.  . Multiple Vitamins-Minerals (ICAPS AREDS 2 PO) Take 1 capsule by mouth 2 (two) times daily.  . nitroGLYCERIN (NITROSTAT) 0.4 MG SL tablet Place 0.4 mg under the tongue every 5 (five) minutes as needed for chest pain.  . potassium chloride SA (K-DUR,KLOR-CON) 20 MEQ tablet Take 1 tablet by mouth 4 (four) times daily.  . ranitidine (ZANTAC) 150 MG tablet Take 1 tablet by mouth 2 (two) times daily.  . Rivaroxaban (XARELTO)  15 MG TABS tablet Take 1 tablet (15 mg total) by mouth daily with supper.  . topiramate (TOPAMAX) 50 MG tablet Take 1 tablet by mouth 4 (four) times daily.  Marland Kitchen torsemide (DEMADEX) 20 MG tablet Take 100 mg by mouth daily.     Allergies:   Patient has no known allergies.   Social History   Socioeconomic History  . Marital status: Married    Spouse name: Not on file  . Number of children: Not on file  . Years of education: Not on file  . Highest education level: Not on file  Occupational History  . Not on file  Social Needs  . Financial resource strain: Not on file  . Food insecurity:    Worry: Not on file    Inability: Not on file    . Transportation needs:    Medical: Not on file    Non-medical: Not on file  Tobacco Use  . Smoking status: Never Smoker  . Smokeless tobacco: Never Used  Substance and Sexual Activity  . Alcohol use: No  . Drug use: No  . Sexual activity: Not on file  Lifestyle  . Physical activity:    Days per week: Not on file    Minutes per session: Not on file  . Stress: Not on file  Relationships  . Social connections:    Talks on phone: Not on file    Gets together: Not on file    Attends religious service: Not on file    Active member of club or organization: Not on file    Attends meetings of clubs or organizations: Not on file    Relationship status: Not on file  Other Topics Concern  . Not on file  Social History Narrative  . Not on file     Family History: The patient's family history includes Coronary artery disease in her mother.  ROS:   Please see the history of present illness.     All other systems reviewed and are negative.  EKGs/Labs/Other Studies Reviewed:    The following studies were reviewed today: Nuclear stress test, EKG, lab work, prior office notes reviewed  EKG: 06/07/2018-sinus rhythm, left axis deviation, borderline LVH no other significant changes personally reviewed and interpreted.  Recent Labs: No results found for requested labs within last 8760 hours.  Recent Lipid Panel    Component Value Date/Time   CHOL 157 06/23/2011 0218   TRIG 136 06/23/2011 0218   HDL 46 06/23/2011 0218   CHOLHDL 3.4 06/23/2011 0218   VLDL 27 06/23/2011 0218   LDLCALC 84 06/23/2011 0218    Physical Exam:    VS:  BP (!) 160/74   Pulse 84   Ht 5\' 2"  (1.575 m)   Wt 211 lb 1.9 oz (95.8 kg)   LMP  (LMP Unknown)   SpO2 91%   BMI 38.61 kg/m     Wt Readings from Last 3 Encounters:  06/18/18 211 lb 1.9 oz (95.8 kg)  06/17/18 208 lb (94.3 kg)  06/07/18 208 lb 12.8 oz (94.7 kg)     GEN:  Well nourished, well developed in no acute distress, obese HEENT:  Normal NECK: No JVD; No carotid bruits LYMPHATICS: No lymphadenopathy CARDIAC: RRR, no murmurs, rubs, gallops, 2+ radial RESPIRATORY:  Clear to auscultation without rales, wheezing or rhonchi  ABDOMEN: Soft, non-tender, non-distended MUSCULOSKELETAL: 2+ chronic lower extremity edema; No deformity  SKIN: Warm and dry NEUROLOGIC:  Alert and oriented x 3 PSYCHIATRIC:  Normal affect  ASSESSMENT:    1. Abnormal cardiovascular stress test   2. Pre-operative laboratory examination   3. Angina pectoris (HCC)   4. Chest pain, unspecified type   5. Paroxysmal atrial fibrillation (HCC)   6. Morbid obesity (HCC)   7. Chronic anticoagulation    PLAN:    In order of problems listed above:  Abnormal nuclear stress test with high risk anterior ischemia in the setting of known moderate LAD coronary artery disease, chest pain - We will proceed with cardiac catheterization.  It is been approximately 5 years since her last assessment.  Risks and benefits including stroke heart attack death renal impairment bleeding have been discussed.  She is willing to proceed.  Hold Xarelto for 3 days prior to heart catheterization.  Coronary artery disease - 50% LAD proximal stenosis previously, has this advanced causing nuclear stress test abnormalities?.  Paroxysmal atrial fibrillation - No recurrence of atrial fibrillation that was previously diagnosed in the hospital setting.  Echocardiogram reassuring.  Chronic anticoagulation - Xarelto is lower dose 15 mg for decreased creatinine clearance given her creatinine range of 1.2-1.5.  Diabetes with hypertension - Medications reviewed, no change.  OSA  - CPAP  Morbid Obesity -Continue to encourage weight loss.  BMI greater than 35 with 2 or more comorbidities.  Decrease carbohydrates.    Medication Adjustments/Labs and Tests Ordered: Current medicines are reviewed at length with the patient today.  Concerns regarding medicines are outlined above.   Orders Placed This Encounter  Procedures  . CBC  . Basic metabolic panel   No orders of the defined types were placed in this encounter.   Patient Instructions  Medication Instructions:  The current medical regimen is effective;  continue present plan and medications.  Labwork: Please have blood work today.  (CBC, BMP)  Testing/Procedures: Your physician has requested that you have a cardiac catheterization. Cardiac catheterization is used to diagnose and/or treat various heart conditions. Doctors may recommend this procedure for a number of different reasons. The most common reason is to evaluate chest pain. Chest pain can be a symptom of coronary artery disease (CAD), and cardiac catheterization can show whether plaque is narrowing or blocking your heart's arteries. This procedure is also used to evaluate the valves, as well as measure the blood flow and oxygen levels in different parts of your heart. For further information please visit [email or web address] Please follow instruction sheet, as given.  Follow-Up: Follow up will be determined based on the results of the above testing.  If you need a refill on your cardiac medications before your next appointment, please call your pharmacy.  Thank you for choosing [facility]!!        Signed, Donato Schultz, MD  06/18/2018 4:58 PM    [facility]

## 2018-06-18 NOTE — Patient Instructions (Signed)
Medication Instructions:  The current medical regimen is effective;  continue present plan and medications.  Labwork: Please have blood work today.  (CBC, BMP)  Testing/Procedures: Your physician has requested that you have a cardiac catheterization. Cardiac catheterization is used to diagnose and/or treat various heart conditions. Doctors may recommend this procedure for a number of different reasons. The most common reason is to evaluate chest pain. Chest pain can be a symptom of coronary artery disease (CAD), and cardiac catheterization can show whether plaque is narrowing or blocking your heart's arteries. This procedure is also used to evaluate the valves, as well as measure the blood flow and oxygen levels in different parts of your heart. For further information please visit [email or web address] Please follow instruction sheet, as given.  Follow-Up: Follow up will be determined based on the results of the above testing.  If you need a refill on your cardiac medications before your next appointment, please call your pharmacy.  Thank you for choosing [facility]!!

## 2018-06-19 LAB — BASIC METABOLIC PANEL
BUN / CREAT RATIO: 16 (ref 12–28)
BUN: 20 mg/dL (ref 8–27)
CHLORIDE: 102 mmol/L (ref 96–106)
CO2: 23 mmol/L (ref 20–29)
CREATININE: 1.29 mg/dL — AB (ref 0.57–1.00)
Calcium: 9.9 mg/dL (ref 8.7–10.3)
GFR calc Af Amer: 44 mL/min/{1.73_m2} — ABNORMAL LOW (ref >59)
GFR calc non Af Amer: 38 mL/min/{1.73_m2} — ABNORMAL LOW (ref >59)
GLUCOSE: 135 mg/dL — AB (ref 65–99)
POTASSIUM: 4.5 mmol/L (ref 3.5–5.2)
SODIUM: 139 mmol/L (ref 134–144)

## 2018-06-19 LAB — CBC
Hematocrit: 34.5 % (ref 34.0–46.6)
Hemoglobin: 11.9 g/dL (ref 11.1–15.9)
MCH: 32.1 pg (ref 26.6–33.0)
MCHC: 34.5 g/dL (ref 31.5–35.7)
MCV: 93 fL (ref 79–97)
PLATELETS: 226 10*3/uL (ref 150–450)
RBC: 3.71 x10E6/uL — ABNORMAL LOW (ref 3.77–5.28)
RDW: 13.1 % (ref 12.3–15.4)
WBC: 6 10*3/uL (ref 3.4–10.8)

## 2018-06-20 ENCOUNTER — Telehealth: Payer: Self-pay | Admitting: *Deleted

## 2018-06-20 NOTE — Telephone Encounter (Signed)
Pt contacted [facility]ter[facility] for: Friday June 21, 2018 12 noon Verified arrival [facility] and [facility] A at: 7AM-pre procedure hydration  No solid food after midnight prior to cath, clear liquids until 5 AM day of procedure. Verified allergies in Epic  Hold: Xarelto-06/18/18 until post procedure Torsemide-06/20/18 until post procedure KCl -06/20/18 until post procedure. Metformin-day of procedure and 48 hours post procedure. Losartan-AM of procedure (she had already taken today).  Except hold medicatiosns AM meds can be  taken pre-cath with sip of water including: ASA 81 mg  Confirmed patient has responsible person to drive home post procedure and for 24 hours after you arrive home: yes

## 2018-06-21 ENCOUNTER — Encounter (HOSPITAL_COMMUNITY)
Admission: RE | Disposition: A | Discharge status: Home / Self Care | Payer: Self-pay | Source: Ambulatory Visit | Attending: Cardiovascular Disease

## 2018-06-21 ENCOUNTER — Ambulatory Visit (HOSPITAL_COMMUNITY)
Admission: RE | Admit: 2018-06-21 | Discharge: 2018-06-21 | Disposition: A | Discharge status: Home / Self Care | Payer: Medicare Other | Source: Ambulatory Visit | Attending: Cardiovascular Disease | Admitting: Cardiovascular Disease

## 2018-06-21 ENCOUNTER — Encounter (HOSPITAL_COMMUNITY): Payer: Self-pay | Admitting: Cardiovascular Disease

## 2018-06-21 ENCOUNTER — Other Ambulatory Visit: Payer: Self-pay

## 2018-06-21 DIAGNOSIS — I480 Paroxysmal atrial fibrillation: Secondary | ICD-10-CM | POA: Insufficient documentation

## 2018-06-21 DIAGNOSIS — I10 Essential (primary) hypertension: Secondary | ICD-10-CM | POA: Diagnosis not present

## 2018-06-21 DIAGNOSIS — R9439 Abnormal result of other cardiovascular function study: Secondary | ICD-10-CM | POA: Insufficient documentation

## 2018-06-21 DIAGNOSIS — E785 Hyperlipidemia, unspecified: Secondary | ICD-10-CM | POA: Insufficient documentation

## 2018-06-21 DIAGNOSIS — Z7901 Long term (current) use of anticoagulants: Secondary | ICD-10-CM | POA: Diagnosis not present

## 2018-06-21 HISTORY — PX: LEFT HEART CATH AND CORONARY ANGIOGRAPHY: CATH118249

## 2018-06-21 LAB — GLUCOSE, CAPILLARY
GLUCOSE-CAPILLARY: 124 mg/dL — AB (ref 70–99)
Glucose-Capillary: 101 mg/dL — ABNORMAL HIGH (ref 70–99)

## 2018-06-21 SURGERY — LEFT HEART CATH AND CORONARY ANGIOGRAPHY
Anesthesia: LOCAL

## 2018-06-21 MED ORDER — IOHEXOL 350 MG/ML SOLN
INTRAVENOUS | Status: DC | PRN
  Administered 2018-06-21: 50 mL via INTRAVENOUS

## 2018-06-21 MED ORDER — VERAPAMIL HCL 2.5 MG/ML IV SOLN
INTRAVENOUS | Status: AC
  Filled 2018-06-21: qty 2

## 2018-06-21 MED ORDER — FENTANYL CITRATE (PF) 100 MCG/2ML IJ SOLN
INTRAMUSCULAR | Status: DC | PRN
  Administered 2018-06-21: 25 ug via INTRAVENOUS

## 2018-06-21 MED ORDER — ACETAMINOPHEN 325 MG PO TABS: 650.0000 mg | ORAL_TABLET | ORAL | Status: DC | PRN

## 2018-06-21 MED ORDER — LIDOCAINE HCL (PF) 1 % IJ SOLN
INTRAMUSCULAR | Status: AC
  Filled 2018-06-21: qty 30

## 2018-06-21 MED ORDER — FENTANYL CITRATE (PF) 100 MCG/2ML IJ SOLN
INTRAMUSCULAR | Status: AC
  Filled 2018-06-21: qty 2

## 2018-06-21 MED ORDER — LIDOCAINE HCL (PF) 1 % IJ SOLN
INTRAMUSCULAR | Status: DC | PRN
  Administered 2018-06-21: 2 mL

## 2018-06-21 MED ORDER — MIDAZOLAM HCL 2 MG/2ML IJ SOLN
INTRAMUSCULAR | Status: AC
  Filled 2018-06-21: qty 2

## 2018-06-21 MED ORDER — SODIUM CHLORIDE 0.9% FLUSH: 3.0000 mL | Freq: Two times a day (BID) | INTRAVENOUS | Status: DC

## 2018-06-21 MED ORDER — ONDANSETRON HCL 4 MG/2ML IJ SOLN: 4.0000 mg | Freq: Four times a day (QID) | INTRAMUSCULAR | Status: DC | PRN

## 2018-06-21 MED ORDER — HEPARIN SODIUM (PORCINE) 1000 UNIT/ML IJ SOLN
INTRAMUSCULAR | Status: DC | PRN
  Administered 2018-06-21: 4500 [IU] via INTRAVENOUS

## 2018-06-21 MED ORDER — HEPARIN (PORCINE) IN NACL 1000-0.9 UT/500ML-% IV SOLN
INTRAVENOUS | Status: AC
  Filled 2018-06-21: qty 500

## 2018-06-21 MED ORDER — SODIUM CHLORIDE 0.9 % IV SOLN: 250.0000 mL | INTRAVENOUS | Status: DC | PRN

## 2018-06-21 MED ORDER — MIDAZOLAM HCL 2 MG/2ML IJ SOLN
INTRAMUSCULAR | Status: DC | PRN
  Administered 2018-06-21: 1 mg via INTRAVENOUS

## 2018-06-21 MED ORDER — SODIUM CHLORIDE 0.9% FLUSH: 3.0000 mL | INTRAVENOUS | Status: DC | PRN

## 2018-06-21 MED ORDER — SODIUM CHLORIDE 0.9 % WEIGHT BASED INFUSION: 1.0000 mL/kg/h | INTRAVENOUS | Status: DC

## 2018-06-21 MED ORDER — VERAPAMIL HCL 2.5 MG/ML IV SOLN
INTRAVENOUS | Status: DC | PRN
  Administered 2018-06-21: 10 mL via INTRA_ARTERIAL

## 2018-06-21 MED ORDER — SODIUM CHLORIDE 0.9 % IV SOLN: INTRAVENOUS | Status: DC

## 2018-06-21 MED ORDER — ASPIRIN 81 MG PO CHEW: 81.0000 mg | CHEWABLE_TABLET | ORAL | Status: DC

## 2018-06-21 MED ORDER — HEPARIN SODIUM (PORCINE) 1000 UNIT/ML IJ SOLN
INTRAMUSCULAR | Status: AC
  Filled 2018-06-21: qty 1

## 2018-06-21 MED ORDER — SODIUM CHLORIDE 0.9 % WEIGHT BASED INFUSION
3.0000 mL/kg/h | INTRAVENOUS | Status: AC
  Administered 2018-06-21: 3 mL/kg/h via INTRAVENOUS

## 2018-06-21 MED ORDER — HEPARIN (PORCINE) IN NACL 1000-0.9 UT/500ML-% IV SOLN
INTRAVENOUS | Status: DC | PRN
  Administered 2018-06-21 (×2): 500 mL

## 2018-06-21 MED ORDER — DIAZEPAM 5 MG PO TABS: 5.0000 mg | ORAL_TABLET | Freq: Four times a day (QID) | ORAL | Status: DC | PRN

## 2018-06-21 SURGICAL SUPPLY — 12 items
CATH INFINITI 5FR ANG PIGTAIL (CATHETERS) ×4 IMPLANT
CATH INFINITI JR4 5F (CATHETERS) ×2 IMPLANT
CATH OPTITORQUE TIG 4.0 5F (CATHETERS) ×2 IMPLANT
DEVICE RAD COMP TR BAND LRG (VASCULAR PRODUCTS) ×2 IMPLANT
GLIDESHEATH SLEND SS 6F .021 (SHEATH) ×2 IMPLANT
GUIDEWIRE INQWIRE 1.5J.035X260 (WIRE) ×1 IMPLANT
INQWIRE 1.5J .035X260CM (WIRE) ×2
KIT HEART LEFT (KITS) ×2 IMPLANT
PACK CARDIAC CATHETERIZATION (CUSTOM PROCEDURE TRAY) ×2 IMPLANT
SYR MEDRAD MARK V 150ML (SYRINGE) ×2 IMPLANT
TRANSDUCER W/STOPCOCK (MISCELLANEOUS) ×2 IMPLANT
TUBING CIL FLEX 10 FLL-RA (TUBING) ×2 IMPLANT

## 2018-06-21 NOTE — Discharge Instructions (Signed)
NO METFORMIN/GLUCOPHAGE X 2 DAYS  Radial Site Care Refer to this sheet in the next few weeks. These instructions provide you with information about caring for yourself after your procedure. Your health care provider may also give you more specific instructions. Your treatment has been planned according to current medical practices, but problems sometimes occur. Call your health care provider if you have any problems or questions after your procedure. What can I expect after the procedure? After your procedure, it is typical to have the following:  Bruising at the radial site that usually fades within 1-2 weeks.  Blood collecting in the tissue (hematoma) that may be painful to the touch. It should usually decrease in size and tenderness within 1-2 weeks.  Follow these instructions at home:  Take medicines only as directed by your health care provider.  You may shower 24-48 hours after the procedure or as directed by your health care provider. Remove the bandage (dressing) and gently wash the site with plain soap and water. Pat the area dry with a clean towel. Do not rub the site, because this may cause bleeding.  Do not take baths, swim, or use a hot tub until your health care provider approves.  Check your insertion site every day for redness, swelling, or drainage.  Do not apply powder or lotion to the site.  Do not flex or bend the affected arm for 24 hours or as directed by your health care provider.  Do not push or pull heavy objects with the affected arm for 24 hours or as directed by your health care provider.  Do not lift over 10 lb (4.5 kg) for 5 days after your procedure or as directed by your health care provider.  Ask your health care provider when it is okay to: ? Return to work or school. ? Resume usual physical activities or sports. ? Resume sexual activity.  Do not drive home if you are discharged the same day as the procedure. Have someone else drive you.  You may  drive 24 hours after the procedure unless otherwise instructed by your health care provider.  Do not operate machinery or power tools for 24 hours after the procedure.  If your procedure was done as an outpatient procedure, which means that you went home the same day as your procedure, a responsible adult should be with you for the first 24 hours after you arrive home.  Keep all follow-up visits as directed by your health care provider. This is important. Contact a health care provider if:  You have a fever.  You have chills.  You have increased bleeding from the radial site. Hold pressure on the site. Get help right away if:  You have unusual pain at the radial site.  You have redness, warmth, or swelling at the radial site.  You have drainage (other than a small amount of blood on the dressing) from the radial site.  The radial site is bleeding, and the bleeding does not stop after 30 minutes of holding steady pressure on the site.  Your arm or hand becomes pale, cool, tingly, or numb. This information is not intended to replace advice given to you by your health care provider. Make sure you discuss any questions you have with your health care provider. Document Released: 10/21/2010 Document Revised: 02/24/2016 Document Reviewed: 04/06/2014 Elsevier Interactive Patient Education  2018 [employer]. Moderate Conscious Sedation, Adult, Care After These instructions provide you with information about caring for yourself after your procedure. Your  health care provider may also give you more specific instructions. Your treatment has been planned according to current medical practices, but problems sometimes occur. Call your health care provider if you have any problems or questions after your procedure. What can I expect after the procedure? After your procedure, it is common:  To feel sleepy for several hours.  To feel clumsy and have poor balance for several hours.  To have poor  judgment for several hours.  To vomit if you eat too soon.  Follow these instructions at home: For at least 24 hours after the procedure:   Do not: ? Participate in activities where you could fall or become injured. ? Drive. ? Use heavy machinery. ? Drink alcohol. ? Take sleeping pills or medicines that cause drowsiness. ? Make important decisions or sign legal documents. ? Take care of children on your own.  Rest. Eating and drinking  Follow the diet recommended by your health care provider.  If you vomit: ? Drink water, juice, or soup when you can drink without vomiting. ? Make sure you have little or no nausea before eating solid foods. General instructions  Have a responsible adult stay with you until you are awake and alert.  Take over-the-counter and prescription medicines only as told by your health care provider.  If you smoke, do not smoke without supervision.  Keep all follow-up visits as told by your health care provider. This is important. Contact a health care provider if:  You keep feeling nauseous or you keep vomiting.  You feel light-headed.  You develop a rash.  You have a fever. Get help right away if:  You have trouble breathing. This information is not intended to replace advice given to you by your health care provider. Make sure you discuss any questions you have with your health care provider. Document Released: 07/09/2013 Document Revised: 02/21/2016 Document Reviewed: 01/08/2016 Elsevier Interactive Patient Education  [employer].

## 2018-06-27 ENCOUNTER — Telehealth: Payer: Self-pay | Admitting: *Deleted

## 2018-06-27 NOTE — Telephone Encounter (Signed)
No follow up needed. OK to to follow up with PCP, continue to treat HTN...  Thanks  Donato Schultz, MD   I spoke with patient, states she is doing fine after her cath. She is aware Dr Anne Fu states no follow-up needed with him, follow-up with PCP to manage HTN, lipids.

## 2018-08-08 ENCOUNTER — Other Ambulatory Visit: Payer: Self-pay | Admitting: Cardiology

## 2018-10-18 ENCOUNTER — Other Ambulatory Visit: Payer: Self-pay | Admitting: Cardiology

## 2019-04-07 ENCOUNTER — Other Ambulatory Visit: Payer: Self-pay | Admitting: Cardiology

## 2019-04-07 NOTE — Telephone Encounter (Signed)
Xarelto 15mg  refill request received; pt is 85 yrs old, wt-95.8kg, Crea-1.29 on 06/18/2018, last seen by Dr. Marlou Porch on 06/18/2018, [place].42ml/min; will send in refill to requested Pharmacy.

## 2019-04-21 ENCOUNTER — Telehealth: Payer: Self-pay | Admitting: Cardiology

## 2019-04-21 NOTE — Telephone Encounter (Signed)
New Message ° ° ° °Left message to confirm appt and get consent  °

## 2019-04-21 NOTE — Telephone Encounter (Signed)

## 2019-04-21 NOTE — Progress Notes (Signed)
Virtual Visit via Telephone Note   This visit type was conducted due to national recommendations for restrictions regarding the COVID-19 Pandemic (e.g. social distancing) in an effort to limit this patient's exposure and mitigate transmission in our community.  Due to her co-morbid illnesses, this patient is at least at moderate risk for complications without adequate follow up.  This format is felt to be most appropriate for this patient at this time.  The patient did not have access to video technology/had technical difficulties with video requiring transitioning to audio format only (telephone).  All issues noted in this document were discussed and addressed.  No physical exam could be performed with this format.  Please refer to the patient's chart for her  consent to telehealth for [facility].   Date:  04/22/2019   ID:  [name], DOB 11/23/1931, MRN [id number]  Patient Location: Home Provider Location: Home  PCP:  Carmin Richmondavis, James W, MD  Cardiologist:  Donato SchultzMark Adryan Druckenmiller, MD  Electrophysiologist:  None   Evaluation Performed:  Follow-Up Visit  Chief Complaint:  SVT follow up  History of Present Illness:    Christine BoyerSylvia F Geter is a 85 y.o. female with Normal Cors on cath 2019 with HTN, HL. PAF on Xarelto. DM with HTN  OSA.  Has been feeling AFIB episodes.   Records reviewed.   When lay down at night more or less has issues. Hurting in the chest, continuous hurting at that time. More at night. Stomach.   GI MD - going to treat H.Pyolri. Stomach "blows up. Get's hard".  The patient does not have symptoms concerning for COVID-19 infection (fever, chills, cough, or new shortness of breath).    Past Medical History:  Diagnosis Date  . CAD (coronary artery disease)    a. Cath 06/23/11.  LAD 50 - 60% ostial, EF 65%;  b. 04/2013 Cath: LM nl, LAD 40-50 ost, LCX nl, OM1/2 nl, RCA/PDA/PLSA nl, EF 70%.  . DJD (degenerative joint disease)   . DM (diabetes mellitus) (HCC)   . Dyspnea    chronic  . GERD (gastroesophageal reflux disease)   . Gout   . HLD (hyperlipidemia)   . HTN (hypertension)   . Obesity   . OSA on CPAP    Past Surgical History:  Procedure Laterality Date  . BACK SURGERY    . CARDIAC CATHETERIZATION  06-23-2011   left heart  . CHOLECYSTECTOMY    . LEFT HEART CATH AND CORONARY ANGIOGRAPHY N/A 06/21/2018   Procedure: LEFT HEART CATH AND CORONARY ANGIOGRAPHY;  Surgeon: Lennette BihariKelly, Thomas A, MD;  Location: [facility] INVASIVE CV LAB;  Service: Cardiovascular;  Laterality: N/A;  . LEFT HEART CATHETERIZATION WITH CORONARY ANGIOGRAM N/A 04/07/2013   Procedure: LEFT HEART CATHETERIZATION WITH CORONARY ANGIOGRAM;  Surgeon: Vesta MixerPhilip J Nahser, MD;  [facility];  Service: Cardiovascular;  Laterality: N/A;  . TOTAL KNEE ARTHROPLASTY     bilateral     Current Meds  Medication Sig  . acetaminophen (TYLENOL) 650 MG CR tablet Take 1,300 mg by mouth every 8 (eight) hours as needed for pain.  Marland Kitchen. allopurinol (ZYLOPRIM) 100 MG tablet Take 100 mg by mouth daily.   Marland Kitchen. atorvastatin (LIPITOR) 10 MG tablet Take 1 tablet (10 mg total) by mouth daily.  . carvedilol (COREG) 25 MG tablet Take 25 mg by mouth 2 (two) times daily with a meal.   . dicyclomine (BENTYL) 10 MG capsule Take 10 mg by mouth 3 (three) times daily before meals.  Marland Kitchen. diltiazem (  CARDIZEM CD) 240 MG 24 hr capsule TAKE 1 CAPSULE BY MOUTH  DAILY  . famotidine (PEPCID) 20 MG tablet Take 20 mg by mouth 2 (two) times daily.  Marland Kitchen levothyroxine (SYNTHROID) 75 MCG tablet Take 75 mcg by mouth daily before breakfast.   . losartan (COZAAR) 100 MG tablet Take 100 mg by mouth daily.    . metFORMIN (GLUCOPHAGE) 500 MG tablet Take 500 mg by mouth 2 (two) times daily with a meal.   . Multiple Vitamins-Minerals (PRESERVISION AREDS 2 PO) Take 1 capsule by mouth 2 (two) times daily.  . nitroGLYCERIN (NITROSTAT) 0.4 MG SL tablet Place 0.4 mg under the tongue every 5 (five) minutes as needed for chest pain.  Vladimir Faster Glycol-Propyl Glycol  (SYSTANE) 0.4-0.3 % SOLN Place 1 drop into both eyes 2 (two) times daily.  . potassium chloride (K-DUR,KLOR-CON) 10 MEQ tablet Take 20 mEq by mouth 2 (two) times daily.   Marland Kitchen topiramate (TOPAMAX) 50 MG tablet Take 100 mg by mouth 2 (two) times daily.   Marland Kitchen torsemide (DEMADEX) 100 MG tablet Take 100 mg by mouth daily.   Alveda Reasons 15 MG TABS tablet TAKE 1 TABLET BY MOUTH  DAILY WITH SUPPER     Allergies:   Patient has no known allergies.   Social History   Tobacco Use  . Smoking status: Never Smoker  . Smokeless tobacco: Never Used  Substance Use Topics  . Alcohol use: No  . Drug use: No     Family Hx: The patient's family history includes Coronary artery disease in her mother.  ROS:   Please see the history of present illness.    No fevers, chills, cough, CP.  All other systems reviewed and are negative.   Prior CV studies:   The following studies were reviewed today:  2020 - Holter monitor reviewed minimum heart rate 49, maximum 154, average 64.  Sinus rhythm predominant with first-degree AV block present occasionally.  She had 1 run of ventricular tachycardia lasting 8 beats at 154 bpm with an average of 132.  She had 37 supraventricular tachycardic runs with the fastest of 19 beats with a max of 139.  Atrial fibrillation occurred less than 1% ranging from 71-1 41 with an average of 99 bpm the longest episode lasting 3 hours and 5 minutes.  Cath 06/21/18: Normal epicardial coronary arteries in a dominant RCA system.  Hyperdynamic LV function with an ejection fraction at least 70% without wall motion abnormality.  LVEDP 23 mm Hg.  RECOMMENDATION: Medical therapy in this patient with a history of hypertension, hyperlipidemia, and PAF.  Suspect breast attenuation artifact contributing to her scintigraphic abnormality on nuclear imaging.  Recommend to resume Rivaroxaban, at currently prescribed dose and frequency, on 06/22/2018.   Labs/Other Tests and Data Reviewed:    EKG:   Holter strips reviewed. NSR/occasional AFIB  Recent Labs: 06/18/2018: BUN 20; Creatinine, Ser 1.29; Hemoglobin 11.9; Platelets 226; Potassium 4.5; Sodium 139   Recent Lipid Panel Lab Results  Component Value Date/Time   CHOL 157 06/23/2011 02:18 AM   TRIG 136 06/23/2011 02:18 AM   HDL 46 06/23/2011 02:18 AM   CHOLHDL 3.4 06/23/2011 02:18 AM   LDLCALC 84 06/23/2011 02:18 AM    Wt Readings from Last 3 Encounters:  04/22/19 198 lb (89.8 kg)  06/21/18 208 lb (94.3 kg)  06/18/18 211 lb 1.9 oz (95.8 kg)     Objective:    Vital Signs:  Ht 5\' 2"  (1.575 m)   Wt 198 lb (  89.8 kg)   LMP  (LMP Unknown)   BMI 36.21 kg/m    VITAL SIGNS:  reviewed able to complete full sentences, alert.  ASSESSMENT & PLAN:    Paroxysmal atrial fibrillation - Monitor demonstrated short episodes 99 bpm.  Xarelto discussed. Was 300 for 90 day supply.   - No CAD on 2019 cath    Stomach pain/ GERD  - GI is seeing. Pepcid. Abx about to start for H pyolri. No CAD on cath.   Hyperlipidemia - Lipid panel reviewed by Dr. Earlene Plateravis, PCP.   CKD 3 -Avoid nephrotoxins, Prior creat 1.29.   Obstructive sleep apnea on CPAP -Continues to use with good effect.  COVID-19 Education: The signs and symptoms of COVID-19 were discussed with the patient and how to seek care for testing (follow up with PCP or arrange E-visit).  The importance of social distancing was discussed today.  Time:   Today, I have spent 21 minutes with the patient with telehealth technology discussing the above problems (and review of records).     Medication Adjustments/Labs and Tests Ordered: Current medicines are reviewed at length with the patient today.  Concerns regarding medicines are outlined above.   Tests Ordered: No orders of the defined types were placed in this encounter.   Medication Changes: No orders of the defined types were placed in this encounter.   Follow Up:  Virtual Visit or In Person in 6 month(s)  Signed, Donato SchultzMark  Chanan Detwiler, MD  04/22/2019 9:59 AM    [facility]

## 2019-04-22 ENCOUNTER — Other Ambulatory Visit: Payer: Self-pay

## 2019-04-22 ENCOUNTER — Telehealth (INDEPENDENT_AMBULATORY_CARE_PROVIDER_SITE_OTHER): Payer: Medicare Other | Admitting: Cardiology

## 2019-04-22 ENCOUNTER — Encounter: Payer: Self-pay | Admitting: Cardiology

## 2019-04-22 VITALS — Ht 62.0 in | Wt 198.0 lb

## 2019-04-22 DIAGNOSIS — G4733 Obstructive sleep apnea (adult) (pediatric): Secondary | ICD-10-CM | POA: Diagnosis not present

## 2019-04-22 DIAGNOSIS — R079 Chest pain, unspecified: Secondary | ICD-10-CM | POA: Diagnosis not present

## 2019-04-22 DIAGNOSIS — I480 Paroxysmal atrial fibrillation: Secondary | ICD-10-CM

## 2019-04-22 DIAGNOSIS — Z7901 Long term (current) use of anticoagulants: Secondary | ICD-10-CM

## 2019-04-22 NOTE — Patient Instructions (Signed)
Medication Instructions:  The current medical regimen is effective;  continue present plan and medications.  If you need a refill on your cardiac medications before your next appointment, please call your pharmacy.   Follow-Up: Follow up in 6 months with Kathyrn Drown, NP and 1 year with Dr. Marlou Porch.  You will receive a letter in the mail 2 months before you are due.  Please call us when you receive this letter to schedule your follow up appointment.  Thank you for choosing [facility]!!

## 2019-05-02 ENCOUNTER — Other Ambulatory Visit: Payer: Self-pay | Admitting: Cardiology

## 2019-06-12 ENCOUNTER — Telehealth: Payer: Self-pay | Admitting: Cardiology

## 2019-06-12 NOTE — Telephone Encounter (Signed)
Follow Up:   Pt is checking on the status of her clearance for her Eliquis.

## 2019-06-13 NOTE — Telephone Encounter (Signed)
Left message for patient to contact office with provider information about upcoming procedure/surgery.

## 2019-06-16 NOTE — Telephone Encounter (Signed)
Call placed to pt re: checking on her surgical clearance. Pt has been made aware we haven't received anything from a surgeon requesting clearance for her Eliquis.  Pt states it's a dr in [facility], but couldn't give me information. Pt advised to reach out to their office and have them send [place] surgical clearance over so we can address it, furthermore, this phone note will be completed.

## 2019-06-17 ENCOUNTER — Telehealth: Payer: Self-pay | Admitting: *Deleted

## 2019-06-17 NOTE — Telephone Encounter (Signed)
   [facility] Pre-operative Risk Assessment    Request for surgical clearance:  1. What type of surgery is being performed? EGD   2. When is this surgery scheduled? TBD   3. What type of clearance is required (medical clearance vs. Pharmacy clearance to hold med vs. Both)? BOTH  4. Are there any medications that need to be held prior to surgery and how long? [facility]    Practice name and name of physician performing surgery? [facility]; LEFT MESSAGE FOR NAME OF SURGEON 5. What is your office phone number [phone]    7.   What is your office fax number [phone]  8.   Anesthesia type (None, local, MAC, general) ? LEFT MESSAGE FOR CALL BACK AS TO TYPE OF ANESTHESIA   Julaine Hua 06/17/2019, 11:31 AM  _________________________________________________________________   (provider comments below)

## 2019-06-17 NOTE — Telephone Encounter (Signed)
   Reviewed chart. Will route to Pharmacy 1st to address anticoagulation.  Then pt will need phone call.    Past medical Hx  Cardiac catheterization September 2019: Normal coronary arteries  Paroxysmal atrial fibrillation  Anticoagulation: Xarelto  CHADS2-VASc=6 (heart failure, hypertension, age x2, diabetes, female)  Diastolic CHF  Diabetes mellitus (no insulin)  Hypertension  Sleep apnea  Last OV with Dr. Marlou Porch: 04/22/2019 Richardson Dopp, PA-C 06/17/2019 1:09 PM

## 2019-06-17 NOTE — Telephone Encounter (Signed)
Patient with diagnosis of PAF on Xarelto 15 mg daily (appropriate dose adjustment) for anticoagulation.    Procedure: EGD Date of procedure: TBD   CHADS2-VASc score of  6 (HTN, AGEx2, DM2, CAD, female); do not see history of heart failure   CrCl 46 mL/min Platelet count 226 (06/18/18)  Per office protocol, patient can hold Xarelto for 1-2 days prior to procedure.   Need to also clarify with pt which DOAC patient is taking - Xarelto is on med list, however pt call in from 5 days ago shows pt calling about her Eliquis.

## 2019-06-17 NOTE — Telephone Encounter (Signed)
I received 2nd fax today stating no specific Gastroenterologist assigned to procedure. Anesthesia will be propofol.

## 2019-06-18 ENCOUNTER — Encounter: Payer: Self-pay | Admitting: *Deleted

## 2019-06-18 NOTE — Telephone Encounter (Signed)
   Primary Cardiologist: Candee Furbish, MD  Chart reviewed as part of pre-operative protocol coverage. Patient was contacted 06/18/2019 in reference to pre-operative risk assessment for pending surgery as outlined below.  FATHIMA BARTL was last seen on 04/22/2019 by Dr. Marlou Porch.  Since that day, DHANVI BOESEN has done well without chest discomfort, significant shortness of breath or syncope.  She is fairly sedentary but is able to do most of her household activities.  Pharmacy notes reviewed.  Patient notes that she is taking Xarelto, not Eliquis.  Chart reviewed again.  She has recent history of cardiac catheterization which demonstrated no CAD.  She does have a history of diastolic CHF and is on high-dose torsemide.  PMH updated.    Per pharmacy, Xarelto can be held 1-2 days prior to procedure.  RCRI: 0.9% DASI: 4.06 METs  Therefore, based on ACC/AHA guidelines, the patient would be at acceptable risk for the planned procedure without further cardiovascular testing.   Callback staff I have faxed a letter to the requesting surgeon. Please call and make sure it was received. I will remove this note from the Preop APP Pool.  Richardson Dopp, PA-C 06/18/2019, 10:45 AM

## 2019-09-02 ENCOUNTER — Other Ambulatory Visit: Payer: Self-pay | Admitting: Cardiology

## 2019-09-02 NOTE — Telephone Encounter (Addendum)
Prescription refill request for Xarelto received.   Last office visit: 04/22/2019, Telemedicine Weight: 89.8 kg Age: 85 y.o. Scr: 1.20, 04/08/2019 via care everywhere CrCl: 48.59 ml/min  Prescription refill sent.

## 2019-12-23 ENCOUNTER — Telehealth: Payer: Self-pay | Admitting: Cardiology

## 2019-12-23 MED ORDER — DILTIAZEM HCL ER COATED BEADS 240 MG PO CP24: 240.0000 mg | ORAL_CAPSULE | Freq: Every day | ORAL | 0 refills | Status: DC

## 2019-12-23 NOTE — Telephone Encounter (Signed)
*  STAT* If patient is at the pharmacy, call can be transferred to refill team.   1. Which medications need to be refilled? (please list name of each medication and dose if known) need a new prescription- changing pharmacy-  Diltiazem  2. Which pharmacy/location (including street and city if local pharmacy) is medication to be sent to? [facility]  3. Do they need a 30 day or 90 day supply? 90 days and refills

## 2019-12-23 NOTE — Telephone Encounter (Signed)
Pt's medication was sent to pt's pharmacy as requested. Confirmation received.  °

## 2020-02-13 ENCOUNTER — Other Ambulatory Visit: Payer: Self-pay | Admitting: Cardiology

## 2020-02-13 NOTE — Telephone Encounter (Signed)
Pt last saw Dr Anne Fu 04/22/19, last labs 08/12/19 Creat 1.4 at [facility] per care everywhere, age 86, weight 89.8kg, CrCl 41.65, based on CrCl pt is on appropriate dosage of Xarelto 15mg  QD.  Will refill rx.

## 2020-04-26 ENCOUNTER — Encounter: Payer: Self-pay | Admitting: Cardiology

## 2020-04-26 ENCOUNTER — Other Ambulatory Visit: Payer: Self-pay

## 2020-04-26 ENCOUNTER — Ambulatory Visit: Payer: Medicare Other | Admitting: Cardiology

## 2020-04-26 VITALS — BP 120/70 | HR 58 | Ht 62.0 in | Wt 201.0 lb

## 2020-04-26 DIAGNOSIS — I10 Essential (primary) hypertension: Secondary | ICD-10-CM | POA: Diagnosis not present

## 2020-04-26 DIAGNOSIS — Z7901 Long term (current) use of anticoagulants: Secondary | ICD-10-CM

## 2020-04-26 DIAGNOSIS — I480 Paroxysmal atrial fibrillation: Secondary | ICD-10-CM | POA: Diagnosis not present

## 2020-04-26 MED ORDER — RIVAROXABAN 15 MG PO TABS: ORAL_TABLET | ORAL | 3 refills | Status: DC

## 2020-04-26 MED ORDER — DILTIAZEM HCL ER COATED BEADS 240 MG PO CP24: 240.0000 mg | ORAL_CAPSULE | Freq: Every day | ORAL | 3 refills | Status: DC

## 2020-04-26 NOTE — Progress Notes (Signed)
Cardiology Office Note:    Date:  04/26/2020   ID:  [name], DOB 10-Jan-1932, MRN [id number]  PCP:  Carmin Richmond, MD  [facility] Cardiologist:  Donato Schultz, MD  [facility] Electrophysiologist:  None   Referring MD: Carmin Richmond, MD     History of Present Illness:    [name] is a 86 y.o. female here for SVT follow-up.  Had cardiac catheterization in 2019 with normal coronary arteries.  Has hypertension hyperlipidemia and paroxysmal atrial fibrillation as well on Xarelto chronic anticoagulation without any bleeding.  Has diabetes with hypertension and obstructive sleep apnea as well.  She is symptomatic with her atrial fibrillation episodes.  When laying down at night can sometimes have some chest discomfort.  She has been to the gastroenterologist, treated for H. pylori.  Her stomach previously was expanding and getting "hard ".  Overall she has been doing very well.  About 3 weeks ago she did have an episode of atrial fibrillation.  They usually occur between 3 and 5 AM in the morning and last a few hours duration at the most.  No chest pain no shortness of breath.  Sometimes has some arthritic pain.  Darlene, her sister was present.  Past Medical History:  Diagnosis Date  . CAD (coronary artery disease)    a. Cath 06/23/11.  LAD 50 - 60% ostial, EF 65%;  b. 04/2013 Cath: LM nl, LAD 40-50 ost, LCX nl, OM1/2 nl, RCA/PDA/PLSA nl, EF 70%.// Cath 06/2018 >> Normal Coronary Ateries   . Chronic diastolic CHF    High dose Torsemide  . DJD (degenerative joint disease)   . DM (diabetes mellitus) (HCC)   . Dyspnea    chronic  . GERD (gastroesophageal reflux disease)   . Gout   . HLD (hyperlipidemia)   . HTN (hypertension)   . Obesity   . OSA on CPAP     Past Surgical History:  Procedure Laterality Date  . BACK SURGERY    . CARDIAC CATHETERIZATION  06-23-2011   left heart  . CHOLECYSTECTOMY    . LEFT HEART CATH AND CORONARY ANGIOGRAPHY N/A 06/21/2018    Procedure: LEFT HEART CATH AND CORONARY ANGIOGRAPHY;  Surgeon: Lennette Bihari, MD;  [facility] INVAS[facility] CV LAB;  Service: Cardiovascular;  Laterality: N/A;  . LEFT HEART CATHETERIZATION WITH CORONARY ANGIOGRAM N/A 04/07/2013   Procedure: LEFT HEART CATHETERIZATION WITH CORONARY ANGIOGRAM;  Surgeon: Vesta Mixer, MD;  Location: [facility] CATH LAB;  Service: Cardiovascular;  Laterality: N/A;  . TOTAL KNEE ARTHROPLASTY     bilateral    Current Medications: Current Meds  Medication Sig  . acetaminophen (TYLENOL) 650 MG CR tablet Take 1,300 mg by mouth every 8 (eight) hours as needed for pain.  Marland Kitchen albuterol (VENTOLIN HFA) 108 (90 Base) MCG/ACT inhaler Inhale 2 puffs into the lungs as needed.  Marland Kitchen allopurinol (ZYLOPRIM) 100 MG tablet Take 100 mg by mouth daily.   Marland Kitchen atorvastatin (LIPITOR) 10 MG tablet Take 1 tablet (10 mg total) by mouth daily.  . carvedilol (COREG) 25 MG tablet Take 25 mg by mouth 2 (two) times daily with a meal.   . dicyclomine (BENTYL) 10 MG capsule Take 10 mg by mouth 3 (three) times daily before meals.  Marland Kitchen diltiazem (CARDIZEM CD) 240 MG 24 hr capsule Take 1 capsule (240 mg total) by mouth daily.  Marland Kitchen doxazosin (CARDURA) 1 MG tablet Take 0.5 mg by mouth daily.  . famotidine (PEPCID) 20 MG tablet Take  20 mg by mouth 2 (two) times daily.  Marland Kitchen glucosamine-chondroitin 500-400 MG tablet Take 1 tablet by mouth 2 (two) times daily.  Marland Kitchen levothyroxine (SYNTHROID) 75 MCG tablet Take 75 mcg by mouth daily before breakfast.   . losartan (COZAAR) 100 MG tablet Take 100 mg by mouth daily.    . metFORMIN (GLUCOPHAGE) 500 MG tablet Take 500 mg by mouth 2 (two) times daily with a meal.   . Multiple Vitamins-Minerals (PRESERVISION AREDS 2 PO) Take 1 capsule by mouth 2 (two) times daily.  . nitroGLYCERIN (NITROSTAT) 0.4 MG SL tablet Place 0.4 mg under the tongue every 5 (five) minutes as needed for chest pain.  Bertram Gala Glycol-Propyl Glycol (SYSTANE) 0.4-0.3 % SOLN Place 1 drop into both eyes 2 (two)  times daily.  . potassium chloride (K-DUR,KLOR-CON) 10 MEQ tablet Take 20 mEq by mouth 2 (two) times daily.   . Rivaroxaban (XARELTO) 15 MG TABS tablet TAKE 1 TABLET BY MOUTH  DAILY WITH SUPPER  . topiramate (TOPAMAX) 50 MG tablet Take 100 mg by mouth 2 (two) times daily.   Marland Kitchen torsemide (DEMADEX) 100 MG tablet Take 100 mg by mouth daily.   . [DISCONTINUED] diltiazem (CARDIZEM CD) 240 MG 24 hr capsule Take 1 capsule (240 mg total) by mouth daily. Please make yearly appt with Dr. Anne Fu for July for future refills. 1st attempt  . [DISCONTINUED] XARELTO 15 MG TABS tablet TAKE 1 TABLET BY MOUTH  DAILY WITH SUPPER     Allergies:   Patient has no known allergies.   Social History   Socioeconomic History  . Marital status: Married    Spouse name: Not on file  . Number of children: Not on file  . Years of education: Not on file  . Highest education level: Not on file  Occupational History  . Not on file  Tobacco Use  . Smoking status: Never Smoker  . Smokeless tobacco: Never Used  Vaping Use  . Vaping Use: Never used  Substance and Sexual Activity  . Alcohol use: No  . Drug use: No  . Sexual activity: Not on file  Other Topics Concern  . Not on file  Social History Narrative  . Not on file   Social Determinants of Health   Financial Resource Strain:   . Difficulty of Paying Living Expenses:   Food Insecurity:   . Worried About Programme researcher, broadcasting/film/video in the Last Year:   . Barista in the Last Year:   Transportation Needs:   . Freight forwarder (Medical):   Marland Kitchen Lack of Transportation (Non-Medical):   Physical Activity:   . Days of Exercise per Week:   . Minutes of Exercise per Session:   Stress:   . Feeling of Stress :   Social Connections:   . Frequency of Communication with Friends and Family:   . Frequency of Social Gatherings with Friends and Family:   . Attends Religious Services:   . Active Member of Clubs or Organizations:   . Attends Banker  Meetings:   Marland Kitchen Marital Status:      Family History: The patient's family history includes Coronary artery disease in her mother.  ROS:   Please see the history of present illness.     All other systems reviewed and are negative.  EKGs/Labs/Other Studies Reviewed:    The following studies were reviewed today:  2020 - Holter monitor reviewed minimum heart rate 49, maximum 154, average 64.  Sinus rhythm predominant  with first-degree AV block present occasionally.  She had 1 run of ventricular tachycardia lasting 8 beats at 154 bpm with an average of 132.  She had 37 supraventricular tachycardic runs with the fastest of 19 beats with a max of 139.  Atrial fibrillation occurred less than 1% ranging from 71-1 41 with an average of 99 bpm the longest episode lasting 3 hours and 5 minutes.  Cath 06/21/18: Normal epicardial coronary arteries in a dominant RCA system.  Hyperdynamic LV function with an ejection fraction at least 70% without wall motion abnormality. LVEDP 23 mm Hg.  Holter strips reviewed. NSR/occasional AFIB  EKG:  EKG is  ordered today.  The ekg ordered today demonstrates sinus bradycardia 58 bpm  Recent Labs: No results found for requested labs within last 8760 hours.  Recent Lipid Panel    Component Value Date/Time   CHOL 157 06/23/2011 0218   TRIG 136 06/23/2011 0218   HDL 46 06/23/2011 0218   CHOLHDL 3.4 06/23/2011 0218   VLDL 27 06/23/2011 0218   LDLCALC 84 06/23/2011 0218    Physical Exam:    VS:  BP 120/70   Pulse 58   Ht 5\' 2"  (1.575 m)   Wt 201 lb (91.2 kg)   LMP  (LMP Unknown)   SpO2 96%   BMI 36.76 kg/m     Wt Readings from Last 3 Encounters:  04/26/20 201 lb (91.2 kg)  04/22/19 198 lb (89.8 kg)  06/21/18 208 lb (94.3 kg)     GEN:  Well nourished, well developed in no acute distress HEENT: Normal NECK: No JVD; No carotid bruits LYMPHATICS: No lymphadenopathy CARDIAC: RRR, no murmurs, rubs, gallops RESPIRATORY:  Clear to auscultation  without rales, wheezing or rhonchi  ABDOMEN: Soft, non-tender, non-distended MUSCULOSKELETAL:  No edema; No deformity  SKIN: Warm and dry NEUROLOGIC:  Alert and oriented x 3 PSYCHIATRIC:  Normal affect   ASSESSMENT:    1. Paroxysmal atrial fibrillation (HCC)   2. Chronic anticoagulation   3. Essential hypertension    PLAN:    In order of problems listed above:  Paroxysmal atrial fibrillation -Prior monitor demonstrated short episodes of age fibrillation 99 bpm.  Continuing Xarelto for anticoagulation. -- 3 weeks since last episode 3-5 am.  Instructed her to take her diltiazem and potentially carvedilol earlier when she is feeling these episodes.  If necessary, we can always give her short acting diltiazem 30 mg to take in the situations but I would rather her take her morning medicines early as described above.  She rarely feels these episodes.  Great. -No coronary artery disease on 2019 cath.  Stomach pain/GERD -GI evaluation.  H. pylori treatment.  Thankfully no CAD.  Doing much better.  Hyperlipidemia -Lipids have been reviewed by Dr. Earlene Plateravis her primary care physician.  Chronic kidney disease stage III -Prior creatinine has been 1.3.  Avoiding nephrotoxins.  Obstructive sleep apnea -On CPAP.  Continues to utilize.      Medication Adjustments/Labs and Tests Ordered: Current medicines are reviewed at length with the patient today.  Concerns regarding medicines are outlined above.  Orders Placed This Encounter  Procedures  . EKG 12-Lead   Meds ordered this encounter  Medications  . diltiazem (CARDIZEM CD) 240 MG 24 hr capsule    Sig: Take 1 capsule (240 mg total) by mouth daily.    Dispense:  90 capsule    Refill:  3  . Rivaroxaban (XARELTO) 15 MG TABS tablet    Sig: TAKE 1 TABLET  BY MOUTH  DAILY WITH SUPPER    Dispense:  90 tablet    Refill:  3    Patient Instructions  Medication Instructions:  The current medical regimen is effective;  continue present plan  and medications.  *If you need a refill on your cardiac medications before your next appointment, please call your pharmacy*  Follow-Up: At [facility], you and your health needs are our priority.  As part of our continuing mission to provide you with exceptional heart care, we have created designated Provider Care Teams.  These Care Teams include your primary Cardiologist (physician) and Advanced Practice Providers (APPs -  Physician Assistants and Nurse Practitioners) who all work together to provide you with the care you need, when you need it.  We recommend signing up for the patient portal called "MyChart".  Sign up information is provided on this After Visit Summary.  MyChart is used to connect with patients for Virtual Visits (Telemedicine).  Patients are able to view lab/test results, encounter notes, upcoming appointments, etc.  Non-urgent messages can be sent to your provider as well.   To learn more about what you can do with MyChart, go to [email or web address].    Your next appointment:   6 month(s)  The format for your next appointment:   In Person  Provider:   Donato Schultz, MD   Thank you for choosing [facility]!!         Signed, Donato Schultz, MD  04/26/2020 11:17 AM    [facility]

## 2020-04-26 NOTE — Patient Instructions (Signed)

## 2020-11-02 ENCOUNTER — Encounter: Payer: Self-pay | Admitting: Cardiology

## 2020-11-02 ENCOUNTER — Other Ambulatory Visit: Payer: Self-pay

## 2020-11-02 ENCOUNTER — Ambulatory Visit: Payer: Medicare Other | Admitting: Cardiology

## 2020-11-02 VITALS — BP 160/70 | HR 60 | Ht 62.0 in | Wt 205.0 lb

## 2020-11-02 DIAGNOSIS — I480 Paroxysmal atrial fibrillation: Secondary | ICD-10-CM | POA: Diagnosis not present

## 2020-11-02 DIAGNOSIS — I5032 Chronic diastolic (congestive) heart failure: Secondary | ICD-10-CM | POA: Diagnosis not present

## 2020-11-02 DIAGNOSIS — I2729 Other secondary pulmonary hypertension: Secondary | ICD-10-CM | POA: Diagnosis not present

## 2020-11-02 DIAGNOSIS — Z7901 Long term (current) use of anticoagulants: Secondary | ICD-10-CM

## 2020-11-02 MED ORDER — CARVEDILOL 25 MG PO TABS: 25.0000 mg | ORAL_TABLET | Freq: Two times a day (BID) | ORAL | 3 refills | Status: DC

## 2020-11-02 MED ORDER — RIVAROXABAN 15 MG PO TABS: ORAL_TABLET | ORAL | 3 refills | Status: DC

## 2020-11-02 MED ORDER — DILTIAZEM HCL ER COATED BEADS 240 MG PO CP24: 240.0000 mg | ORAL_CAPSULE | Freq: Every day | ORAL | 3 refills | Status: DC

## 2020-11-02 NOTE — Patient Instructions (Signed)
Medication Instructions:  The current medical regimen is effective;  continue present plan and medications.  *If you need a refill on your cardiac medications before your next appointment, please call your pharmacy*  Follow-Up: At [facility], you and your health needs are our priority.  As part of our continuing mission to provide you with exceptional heart care, we have created designated Provider Care Teams.  These Care Teams include your primary Cardiologist (physician) and Advanced Practice Providers (APPs -  Physician Assistants and Nurse Practitioners) who all work together to provide you with the care you need, when you need it.  We recommend signing up for the patient portal called "MyChart".  Sign up information is provided on this After Visit Summary.  MyChart is used to connect with patients for Virtual Visits (Telemedicine).  Patients are able to view lab/test results, encounter notes, upcoming appointments, etc.  Non-urgent messages can be sent to your provider as well.   To learn more about what you can do with MyChart, go to [email or web address].    Your next appointment:   12 month(s)  The format for your next appointment:   In Person  Provider:   Mark Skains, MD   Thank you for choosing [facility]!!      

## 2020-11-02 NOTE — Progress Notes (Signed)
Cardiology Office Note:    Date:  11/02/2020   ID:  [name], DOB Jan 21, 1932, MRN [id number]  PCP:  John Giovanni, MD  [facility] Cardiologist:  Donato Schultz, MD  [facility] Electrophysiologist:  None   Referring MD: Carmin Richmond, MD     History of Present Illness:    [name] is a 87 y.o. female here for the follow-up of supraventricular tachycardia.  Had SOB, went to ER 10/04/20.   Takes torsemide 100mg . Accidentally took 20mg  pill for a while only.  Nephrology following.  Now that she is back on the torsemide dose, she is feeling better.  Cardiac catheterization in 2019-normal coronary arteries  Paroxysmal atrial fibrillation-on Xarelto for chronic anticoagulation.  Obstructive sleep apnea-treated.  Treated for H. pylori in the past.  , her daughter accompanied her on this visit.  Darlene her other daughter came to the previous visit.  Past Medical History:  Diagnosis Date  . CAD (coronary artery disease)    a. Cath 06/23/11.  LAD 50 - 60% ostial, EF 65%;  b. 04/2013 Cath: LM nl, LAD 40-50 ost, LCX nl, OM1/2 nl, RCA/PDA/PLSA nl, EF 70%.// Cath 06/2018 >> Normal Coronary Ateries   . Chronic diastolic CHF    High dose Torsemide  . DJD (degenerative joint disease)   . DM (diabetes mellitus) (HCC)   . Dyspnea    chronic  . GERD (gastroesophageal reflux disease)   . Gout   . HLD (hyperlipidemia)   . HTN (hypertension)   . Obesity   . OSA on CPAP     Past Surgical History:  Procedure Laterality Date  . BACK SURGERY    . CARDIAC CATHETERIZATION  06-23-2011   left heart  . CHOLECYSTECTOMY    . LEFT HEART CATH AND CORONARY ANGIOGRAPHY N/A 06/21/2018   Procedure: LEFT HEART CATH AND CORONARY ANGIOGRAPHY;  Surgeon: 06-25-2011, MD;  Location: [facility] INVASIVE CV LAB;  Service: Cardiovascular;  Laterality: N/A;  . LEFT HEART CATHETERIZATION WITH CORONARY ANGIOGRAM N/A 04/07/2013   Procedure: LEFT HEART CATHETERIZATION WITH CORONARY ANGIOGRAM;   Surgeon: Lennette Bihari, MD;  Location: [facility] CATH LAB;  Service: Cardiovascular;  Laterality: N/A;  . TOTAL KNEE ARTHROPLASTY     bilateral    Current Medications: Current Meds  Medication Sig  . acetaminophen (TYLENOL) 650 MG CR tablet Take 1,300 mg by mouth every 8 (eight) hours as needed for pain.  Vesta Mixer albuterol (VENTOLIN HFA) 108 (90 Base) MCG/ACT inhaler Inhale 2 puffs into the lungs as needed.  [facility] allopurinol (ZYLOPRIM) 100 MG tablet Take 100 mg by mouth daily.   Marland Kitchen atorvastatin (LIPITOR) 10 MG tablet Take 1 tablet (10 mg total) by mouth daily.  Marland Kitchen dicyclomine (BENTYL) 10 MG capsule Take 10 mg by mouth 3 (three) times daily before meals.  . doxazosin (CARDURA) 1 MG tablet Take 0.5 mg by mouth daily.  . famotidine (PEPCID) 20 MG tablet Take 20 mg by mouth 2 (two) times daily.  Marland Kitchen glucosamine-chondroitin 500-400 MG tablet Take 1 tablet by mouth 2 (two) times daily.  Marland Kitchen levothyroxine (SYNTHROID) 88 MCG tablet Take 88 mcg by mouth daily.  Marland Kitchen losartan (COZAAR) 100 MG tablet Take 100 mg by mouth daily.    . metFORMIN (GLUCOPHAGE) 500 MG tablet Take 500 mg by mouth 2 (two) times daily with a meal.   . Multiple Vitamins-Minerals (PRESERVISION AREDS 2 PO) Take 1 capsule by mouth 2 (two) times daily.  . nitroGLYCERIN (NITROSTAT) 0.4 MG SL  tablet Place 0.4 mg under the tongue every 5 (five) minutes as needed for chest pain.  Bertram Gala Glycol-Propyl Glycol 0.4-0.3 % SOLN Place 1 drop into both eyes 2 (two) times daily.  . potassium chloride (K-DUR,KLOR-CON) 10 MEQ tablet Take 20 mEq by mouth 2 (two) times daily.   Marland Kitchen topiramate (TOPAMAX) 50 MG tablet Take 100 mg by mouth 2 (two) times daily.   Marland Kitchen torsemide (DEMADEX) 100 MG tablet Take 100 mg by mouth daily.  . [DISCONTINUED] carvedilol (COREG) 25 MG tablet Take 25 mg by mouth 2 (two) times daily with a meal.  . [DISCONTINUED] diltiazem (CARDIZEM CD) 240 MG 24 hr capsule Take 1 capsule (240 mg total) by mouth daily.  . [DISCONTINUED] Rivaroxaban (XARELTO)  15 MG TABS tablet TAKE 1 TABLET BY MOUTH  DAILY WITH SUPPER     Allergies:   Patient has no known allergies.   Social History   Socioeconomic History  . Marital status: Married    Spouse name: Not on file  . Number of children: Not on file  . Years of education: Not on file  . Highest education level: Not on file  Occupational History  . Not on file  Tobacco Use  . Smoking status: Never Smoker  . Smokeless tobacco: Never Used  Vaping Use  . Vaping Use: Never used  Substance and Sexual Activity  . Alcohol use: No  . Drug use: No  . Sexual activity: Not on file  Other Topics Concern  . Not on file  Social History Narrative  . Not on file   Social Determinants of Health   Financial Resource Strain: Not on file  Food Insecurity: Not on file  Transportation Needs: Not on file  Physical Activity: Not on file  Stress: Not on file  Social Connections: Not on file     Family History: The patient's family history includes Coronary artery disease in her mother.  ROS:   Please see the history of present illness.     All other systems reviewed and are negative.  EKGs/Labs/Other Studies Reviewed:    The following studies were reviewed today:  10/25/2020 ECHO: 1. The left ventricle is normal in size with mildly increased wall  thickness.  2. The left ventricular systolic function is normal, LVEF is visually  estimated at 60-65%.  3. There is grade II diastolic dysfunction (elevated filling pressure).  4. The right ventricle is normal in size, with normal systolic function.  5. The left atrium is mildly dilated in size.  6. There is trivial tricuspid regurgitation.  7. There is moderate pulmonary hypertension, estimated pulmonary artery  systolic pressure is 53 mmHg.    2020 -Holter monitorreviewed minimum heart rate 49, maximum 154, average 64. Sinus rhythm predominant with first-degree AV block present occasionally. She had 1 run of ventricular  tachycardia lasting 8 beats at 154 bpm with an average of 132. She had 37 supraventricular tachycardic runs with the fastest of 19 beats with a max of 139. Atrial fibrillation occurred less than 1% ranging from 71-1 41 with an average of 99 bpm the longest episode lasting 3 hours and 5 minutes.  Cath 06/21/18: Normal epicardial coronary arteries in a dominant RCA system.  Hyperdynamic LV function with an ejection fraction at least 70% without wall motion abnormality. LVEDP 23 mm Hg.  Holter strips reviewed. NSR/occasional AFIB    Recent Labs: No results found for requested labs within last 8760 hours.  Recent Lipid Panel    Component Value  Date/Time   CHOL 157 06/23/2011 0218   TRIG 136 06/23/2011 0218   HDL 46 06/23/2011 0218   CHOLHDL 3.4 06/23/2011 0218   VLDL 27 06/23/2011 0218   LDLCALC 84 06/23/2011 0218     Risk Assessment/Calculations:      Physical Exam:    VS:  BP (!) 160/70 (BP Location: Left Arm, Patient Position: Sitting, Cuff Size: Normal)   Pulse 60   Ht 5\' 2"  (1.575 m)   Wt 205 lb (93 kg)   LMP  (LMP Unknown)   SpO2 95%   BMI 37.49 kg/m     Wt Readings from Last 3 Encounters:  11/02/20 205 lb (93 kg)  04/26/20 201 lb (91.2 kg)  04/22/19 198 lb (89.8 kg)     GEN:  Well nourished, well developed in no acute distress HEENT: Normal NECK: No JVD; No carotid bruits LYMPHATICS: No lymphadenopathy CARDIAC: RRR, no murmurs, rubs, gallops RESPIRATORY:  Clear to auscultation without rales, wheezing or rhonchi  ABDOMEN: Soft, non-tender, non-distended MUSCULOSKELETAL:  1-2+ BLEedema; No deformity  SKIN: Warm and dry NEUROLOGIC:  Alert and oriented x 3 PSYCHIATRIC:  Normal affect   ASSESSMENT:    1. Paroxysmal atrial fibrillation (HCC)   2. Chronic anticoagulation   3. Chronic diastolic heart failure (HCC)   4. Other secondary pulmonary hypertension (HCC)    PLAN:    In order of problems listed above:  Paroxysmal atrial  fibrillation -Continue Xarelto.  Prior Holter monitor demonstrated very short episode of atrial fibrillation at 99 bpm.  Chronic diastolic heart failure -Grade 2 diastolic dysfunction noted on echocardiogram reviewed as above.  Secondary pulmonary pretension also noted.  Continue with torsemide, carvedilol, Cardizem.  She is also on losartan 100 mg as well.  Stomach pain/GERD -Prior GI evaluation.  No CAD on cardiac cath in 2019.  Hyperlipidemia -On atorvastatin 10 mg.  Lipids have been managed by Dr. 2020.  Chronic kidney disease stage IIIa -Creatinine has been 1.5 in the past.  Obstructive sleep apnea -Continue with CPAP   Medication Adjustments/Labs and Tests Ordered: Current medicines are reviewed at length with the patient today.  Concerns regarding medicines are outlined above.  No orders of the defined types were placed in this encounter.  Meds ordered this encounter  Medications  . carvedilol (COREG) 25 MG tablet    Sig: Take 1 tablet (25 mg total) by mouth 2 (two) times daily with a meal.    Dispense:  180 tablet    Refill:  3  . Rivaroxaban (XARELTO) 15 MG TABS tablet    Sig: TAKE 1 TABLET BY MOUTH  DAILY WITH SUPPER    Dispense:  90 tablet    Refill:  3  . diltiazem (CARDIZEM CD) 240 MG 24 hr capsule    Sig: Take 1 capsule (240 mg total) by mouth daily.    Dispense:  90 capsule    Refill:  3    Patient Instructions  Medication Instructions:  The current medical regimen is effective;  continue present plan and medications.  *If you need a refill on your cardiac medications before your next appointment, please call your pharmacy*  Follow-Up: At [facility], you and your health needs are our priority.  As part of our continuing mission to provide you with exceptional heart care, we have created designated Provider Care Teams.  These Care Teams include your primary Cardiologist (physician) and Advanced Practice Providers (APPs -  Physician Assistants and Nurse  Practitioners) who all work together to  provide you with the care you need, when you need it.  We recommend signing up for the patient portal called "MyChart".  Sign up information is provided on this After Visit Summary.  MyChart is used to connect with patients for Virtual Visits (Telemedicine).  Patients are able to view lab/test results, encounter notes, upcoming appointments, etc.  Non-urgent messages can be sent to your provider as well.   To learn more about what you can do with MyChart, go to [email or web address].    Your next appointment:   12 month(s)  The format for your next appointment:   In Person  Provider:   Donato Schultz, MD   Thank you for choosing [facility]!!         Signed, Donato Schultz, MD  11/02/2020 10:18 AM    [facility]

## 2020-11-04 ENCOUNTER — Telehealth: Payer: Self-pay | Admitting: Cardiology

## 2020-11-04 NOTE — Telephone Encounter (Signed)
New Message:    Weston Brass from [employer] said he needs a dignosis for pt's Xarelto.

## 2020-11-04 NOTE — Telephone Encounter (Signed)
Received a call back from our MR department stating she spoke with her supervisor who instructed her "it is up to clinical what they decide to tell [employer]"  The legalities of it were not addressed.

## 2020-11-04 NOTE — Telephone Encounter (Signed)
Attempted to call Weston Brass at [employer] to discuss this request.  Held for 5 minutes without answer.  I question why [facility] is asking this question now as the pt has been taking Xarelto since 2017 for At Fib.  Can Weston Brass not obtain this information from the pt's insurance company claims?  What is the legality of this office releasing information to [facility]? I asked the person working here in our office in the medical records department who stated she did not know the answer but would ask her manager about it.

## 2020-11-05 NOTE — Telephone Encounter (Signed)
Spoke w/ Kathlene November, RPh @ [facility].   He reports that pt's ins requires an ICD-10 for this strength of Xarelto. Advised him that her dx is paroxysmal afib. He states that dx should cover her meds, as "they don't like to pay for DVT prophylaxis long term".  He is appreciative of the call back.

## 2020-11-05 NOTE — Telephone Encounter (Signed)
Pt c/o medication issue:  1. Name of Medication: Rivaroxaban (XARELTO) 15 MG TABS tablet  2. How are you currently taking this medication (dosage and times per day)? N/a   3. Are you having a reaction (difficulty breathing--STAT)? N/a   4. What is your medication issue?   Homero Fellers with [facility] is following up. He is requesting a reason for long-term use of the medication.  Please return call to Homero Fellers to discuss at [id number] (reference#: [id number]).

## 2021-08-04 ENCOUNTER — Telehealth: Payer: Self-pay | Admitting: Cardiology

## 2021-08-04 NOTE — Telephone Encounter (Signed)
Spoke with pt and was seen today at PCP  and was told to call re SOB off and on for a week  Pt had CXR today as well results pending .Per pt currently taking Torsemide 100 mg every day and Invokana 100 mg qd. Appt made with Dr Anne Fu for tomorrow at 4:30 am .Zack Seal

## 2021-08-04 NOTE — Telephone Encounter (Signed)
Pt c/o Shortness Of Breath: STAT if SOB developed within the last 24 hours or pt is noticeably SOB on the phone  1. Are you currently SOB (can you hear that pt is SOB on the phone)?  No   2. How long have you been experiencing SOB?  Past week, on and off  3. Are you SOB when sitting or when up moving around?  When up and moving   4. Are you currently experiencing any other symptoms?  No

## 2021-08-05 ENCOUNTER — Other Ambulatory Visit: Payer: Self-pay

## 2021-08-05 ENCOUNTER — Encounter: Payer: Self-pay | Admitting: Cardiology

## 2021-08-05 ENCOUNTER — Ambulatory Visit: Payer: Medicare Other | Admitting: Cardiology

## 2021-08-05 DIAGNOSIS — I5032 Chronic diastolic (congestive) heart failure: Secondary | ICD-10-CM | POA: Diagnosis not present

## 2021-08-05 DIAGNOSIS — I480 Paroxysmal atrial fibrillation: Secondary | ICD-10-CM | POA: Diagnosis not present

## 2021-08-05 DIAGNOSIS — E7800 Pure hypercholesterolemia, unspecified: Secondary | ICD-10-CM | POA: Insufficient documentation

## 2021-08-05 DIAGNOSIS — R0602 Shortness of breath: Secondary | ICD-10-CM | POA: Diagnosis not present

## 2021-08-05 NOTE — Patient Instructions (Signed)
Medication Instructions:  The current medical regimen is effective;  continue present plan and medications.  *If you need a refill on your cardiac medications before your next appointment, please call your pharmacy*  Follow-Up: At [facility], you and your health needs are our priority.  As part of our continuing mission to provide you with exceptional heart care, we have created designated Provider Care Teams.  These Care Teams include your primary Cardiologist (physician) and Advanced Practice Providers (APPs -  Physician Assistants and Nurse Practitioners) who all work together to provide you with the care you need, when you need it.  We recommend signing up for the patient portal called "MyChart".  Sign up information is provided on this After Visit Summary.  MyChart is used to connect with patients for Virtual Visits (Telemedicine).  Patients are able to view lab/test results, encounter notes, upcoming appointments, etc.  Non-urgent messages can be sent to your provider as well.   To learn more about what you can do with MyChart, go to [email or web address].    Your next appointment:   6 month(s)  The format for your next appointment:   In Person  Provider:   Dayna Dunn, PA-C, Laura Ingold, NP, Michele Lenze, PA-C, Michelle Swinyer, NP, or Scott Weaver, PA-C     Then, Mark Skains, MD will plan to see you again in 1 year(s).    Thank you for choosing [facility]!!     

## 2021-08-05 NOTE — Assessment & Plan Note (Signed)
On atorvastatin 10 mg a day.  Prior blood work by Dr. Alinda Money.

## 2021-08-05 NOTE — Progress Notes (Signed)
Cardiology Office Note:    Date:  08/05/2021   ID:  [name], DOB 1931-12-27, MRN [id number]  PCP:  John Giovanni, MD  [facility] Cardiologist:  Donato Schultz, MD  [facility] Electrophysiologist:  None   Referring MD: John Giovanni, MD    History of Present Illness:    [name] is a 87 y.o. female here for the follow-up of shortness of breath.  She called the office 08/04/2021 complaining of intermittent shortness of breath for 1 week prior. This would occur while she was up and moving. Current regimen includes 100 mg Torsemide daily and 100 mg Invokana daily. CXR performed 11/3 showed no acute findings.  Previously here for the follow-up of supraventricular tachycardia.  Had SOB, went to ER 10/04/20.   Takes torsemide 100mg . Accidentally took 20mg  pill for a while only.  Nephrology following. Now that she is back on the torsemide dose, she is feeling better.  Cardiac catheterization in 2019-normal coronary arteries  Paroxysmal atrial fibrillation-on Xarelto for chronic anticoagulation.  Obstructive sleep apnea-treated.  Treated for H. pylori in the past.  , her daughter accompanied her on last visit.  Darlene her other daughter came previously.  Today: She is accompanied by 2 family members.  Overall, she is feeling better today. Yesterday she was severely short of breath, and was unable to speak at one time. For the past week or so she has had shortness of breath with minimal exertion, such as when carrying objects or walking down a hallway.  Also she endorses some abdominal tightness, which she has noticed is especially prominent during an episode of atrial fibrillation.  Lately she has been having difficulty reaching forwards with her left UE, with occasional "jerking" movements. She is experiencing pain on the underside of her left upper arm. Of note, she had a fall in 12/2020, and suffered a fracture in her right UE.   Currently she is alternating  taking torsemide 20 mg one day, and 40 mg the next day. If she notices any edema, she is instructed to take 40 mg for a few days. Previously she was taking 100 mg torsemide.  She denies any palpitations, lightheadedness, headaches, syncope, orthopnea, or PND.   Past Medical History:  Diagnosis Date   CAD (coronary artery disease)    a. Cath 06/23/11.  LAD 50 - 60% ostial, EF 65%;  b. 04/2013 Cath: LM nl, LAD 40-50 ost, LCX nl, OM1/2 nl, RCA/PDA/PLSA nl, EF 70%.// Cath 06/2018 >> Normal Coronary Ateries    Chronic diastolic CHF    High dose Torsemide   DJD (degenerative joint disease)    DM (diabetes mellitus) (HCC)    Dyspnea    chronic   GERD (gastroesophageal reflux disease)    Gout    HLD (hyperlipidemia)    HTN (hypertension)    Obesity    OSA on CPAP     Past Surgical History:  Procedure Laterality Date   BACK SURGERY     CARDIAC CATHETERIZATION  06-23-2011   left heart   CHOLECYSTECTOMY     LEFT HEART CATH AND CORONARY ANGIOGRAPHY N/A 06/21/2018   Procedure: LEFT HEART CATH AND CORONARY ANGIOGRAPHY;  Surgeon: 06-25-2011, MD;  Location: [facility] INVASIVE CV LAB;  Service: Cardiovascular;  Laterality: N/A;   LEFT HEART CATHETERIZATION WITH CORONARY ANGIOGRAM N/A 04/07/2013   Procedure: LEFT HEART CATHETERIZATION WITH CORONARY ANGIOGRAM;  Surgeon: Lennette Bihari, MD;  Location: [facility] CATH LAB;  Service: Cardiovascular;  Laterality: N/A;  TOTAL KNEE ARTHROPLASTY     bilateral    Current Medications: Current Meds  Medication Sig   acetaminophen (TYLENOL) 650 MG CR tablet Take 1,300 mg by mouth every 8 (eight) hours as needed for pain.   allopurinol (ZYLOPRIM) 100 MG tablet Take 100 mg by mouth daily.    atorvastatin (LIPITOR) 10 MG tablet Take 1 tablet (10 mg total) by mouth daily.   canagliflozin (INVOKANA) 100 MG TABS tablet Take 100 mg by mouth daily before breakfast.   carvedilol (COREG) 25 MG tablet Take 1 tablet (25 mg total) by mouth 2 (two) times daily with a meal.    diltiazem (CARDIZEM CD) 240 MG 24 hr capsule Take 1 capsule (240 mg total) by mouth daily.   doxazosin (CARDURA) 1 MG tablet Take 0.5 mg by mouth daily.   famotidine (PEPCID) 20 MG tablet Take 20 mg by mouth 2 (two) times daily.   levothyroxine (SYNTHROID) 75 MCG tablet Take 75 mcg by mouth daily.   losartan (COZAAR) 100 MG tablet Take 100 mg by mouth daily.     Multiple Vitamins-Minerals (PRESERVISION AREDS 2 PO) Take 1 capsule by mouth 2 (two) times daily.   nitroGLYCERIN (NITROSTAT) 0.4 MG SL tablet Place 0.4 mg under the tongue every 5 (five) minutes as needed for chest pain.   Polyethyl Glycol-Propyl Glycol 0.4-0.3 % SOLN Place 1 drop into both eyes 2 (two) times daily.   potassium chloride (K-DUR,KLOR-CON) 10 MEQ tablet Take 20 mEq by mouth 2 (two) times daily.    Rivaroxaban (XARELTO) 15 MG TABS tablet TAKE 1 TABLET BY MOUTH  DAILY WITH SUPPER   torsemide (DEMADEX) 20 MG tablet Take 20 mg by mouth as directed. Take 20 mg alternating with 40 mg every other day     Allergies:   Patient has no known allergies.   Social History   Socioeconomic History   Marital status: Married    Spouse name: Not on file   Number of children: Not on file   Years of education: Not on file   Highest education level: Not on file  Occupational History   Not on file  Tobacco Use   Smoking status: Never   Smokeless tobacco: Never  Vaping Use   Vaping Use: Never used  Substance and Sexual Activity   Alcohol use: No   Drug use: No   Sexual activity: Not on file  Other Topics Concern   Not on file  Social History Narrative   Not on file   Social Determinants of Health   Financial Resource Strain: Not on file  Food Insecurity: Not on file  Transportation Needs: Not on file  Physical Activity: Not on file  Stress: Not on file  Social Connections: Not on file     Family History: The patient's family history includes Coronary artery disease in her mother.  ROS:   Please see the history of  present illness.    (+) Exertional shortness of breath (+) Fatigue (+) Abdominal tightness (+) Left UE pain, difficulty with ambulation All other systems reviewed and are negative.  EKGs/Labs/Other Studies Reviewed:    The following studies were reviewed today:  XR Chest [facility]) 08/04/2021: COMPARISON: May 19.   TECHNIQUE: PA and lateral views of the chest were performed.   FINDINGS:  Clear lungs with no pleural effusion. Stable minimal linear scar left base. Cardiomediastinal contour unremarkable with calcification aorta.   IMPRESSION:  No acute findings.  10/25/2020 ECHO [facility]):  1. The  left ventricle is normal in size with mildly increased wall  thickness.    2. The left ventricular systolic function is normal, LVEF is visually  estimated at 60-65%.    3. There is grade II diastolic dysfunction (elevated filling pressure).    4. The right ventricle is normal in size, with normal systolic function.    5. The left atrium is mildly dilated in size.    6. There is trivial tricuspid regurgitation.    7. There is moderate pulmonary hypertension, estimated pulmonary artery  systolic pressure is 53 mmHg.    03/2019 - Holter monitor reviewed minimum heart rate 49, maximum 154, average 64.  Sinus rhythm predominant with first-degree AV block present occasionally.  She had 1 run of ventricular tachycardia lasting 8 beats at 154 bpm with an average of 132.  She had 37 supraventricular tachycardic runs with the fastest of 19 beats with a max of 139.  Atrial fibrillation occurred less than 1% ranging from 71-1 41 with an average of 99 bpm the longest episode lasting 3 hours and 5 minutes.   Cath 06/21/18: Normal epicardial coronary arteries in a dominant RCA system.   Hyperdynamic LV function with an ejection fraction at least 70% without wall motion abnormality.  LVEDP 23 mm Hg.   Holter strips reviewed. NSR/occasional AFIB   Nuclear Stress 06/17/2018: Nuclear  stress EF: 79%. There was no ST segment deviation noted during stress. Defect 1: There is a large defect of mild severity present in the basal anterolateral, mid anterior, mid anteroseptal, mid anterolateral, apical anterior, apical septal and apical lateral location. This is a high risk study. Findings consistent with ischemia. The left ventricular ejection fraction is hyperdynamic (>65%).   There is a large size, mild severity, reversible defect consistent with ischemia in the LAD territory (SDS =6). A cardiac catheterization is recommended.  EKG:  EKG is personally reviewed and interpreted. 08/05/2021: Sinus bradycardia. Rate 55 bpm. PAC. 11/02/2020: EKG was not ordered.  Recent Labs: No results found for requested labs within last 8760 hours.   Recent Lipid Panel    Component Value Date/Time   CHOL 157 06/23/2011 0218   TRIG 136 06/23/2011 0218   HDL 46 06/23/2011 0218   CHOLHDL 3.4 06/23/2011 0218   VLDL 27 06/23/2011 0218   LDLCALC 84 06/23/2011 0218     Risk Assessment/Calculations:      Physical Exam:    VS:  BP (!) 160/80 (BP Location: Left Arm, Patient Position: Sitting, Cuff Size: Normal)   Pulse (!) 55   Ht 5\' 2"  (1.575 m)   Wt 204 lb (92.5 kg)   LMP  (LMP Unknown)   SpO2 97%   BMI 37.31 kg/m     Wt Readings from Last 3 Encounters:  08/05/21 204 lb (92.5 kg)  11/02/20 205 lb (93 kg)  04/26/20 201 lb (91.2 kg)     GEN:  Well nourished, well developed in no acute distress HEENT: Normal NECK: No JVD; No carotid bruits LYMPHATICS: No lymphadenopathy CARDIAC: RRR, no murmurs, rubs, gallops RESPIRATORY:  Clear to auscultation without rales, wheezing or rhonchi  ABDOMEN: Soft, non-tender, non-distended MUSCULOSKELETAL:  1-2+ BLE edema; tenderness of triceps on ambulation, No deformity  SKIN: Warm and dry NEUROLOGIC:  Alert and oriented x 3 PSYCHIATRIC:  Normal affect   ASSESSMENT:    1. Shortness of breath   2. Paroxysmal atrial fibrillation (HCC)    3. Chronic diastolic heart failure (HCC)   4. Pure hypercholesterolemia  PLAN:    In order of problems listed above:  Shortness of breath As described under chronic diastolic heart failure.  Continue fluid balance with torsemide.  Also adequate blood pressure control with multidrug regimen.  Paroxysmal atrial fibrillation (HCC) Continue with Xarelto.  Prior Holter monitor showed a very short episode of atrial fibrillation at 99 bpm.  Currently in sinus rhythm.  Chronic diastolic heart failure (HCC) Grade 2 diastolic dysfunction noted on prior echocardiogram.  Secondary pulmonary hypertension also noted.  Multidrug regimen continuing to be utilized including losartan 20 mg 40mg  (changed because creatinine at 1 point went up to 2.3, currently in the 1.5 range),  carvedilol 25 mg twice a day, Cardizem 240 daily.  It is certainly possible that some of her shortness of breath is fluid related.  When she becomes overloaded, this can cause shortness of breath.  I think taking an additional 20 mg makes sense during these episodes.  Spoke with family.  Pure hypercholesterolemia On atorvastatin 10 mg a day.  Prior blood work by Dr. Alinda Money.   Follow-up:   6 months with APP. 1 year with Dr. Anne Fu.  Medication Adjustments/Labs and Tests Ordered: Current medicines are reviewed at length with the patient today.  Concerns regarding medicines are outlined above.   Orders Placed This Encounter  Procedures   EKG 12-Lead    No orders of the defined types were placed in this encounter.   Patient Instructions  Medication Instructions:  The current medical regimen is effective;  continue present plan and medications.  *If you need a refill on your cardiac medications before your next appointment, please call your pharmacy*  Follow-Up: At [facility], you and your health needs are our priority.  As part of our continuing mission to provide you with exceptional heart care, we have created  designated Provider Care Teams.  These Care Teams include your primary Cardiologist (physician) and Advanced Practice Providers (APPs -  Physician Assistants and Nurse Practitioners) who all work together to provide you with the care you need, when you need it.  We recommend signing up for the patient portal called "MyChart".  Sign up information is provided on this After Visit Summary.  MyChart is used to connect with patients for Virtual Visits (Telemedicine).  Patients are able to view lab/test results, encounter notes, upcoming appointments, etc.  Non-urgent messages can be sent to your provider as well.   To learn more about what you can do with MyChart, go to [email or web address].    Your next appointment:   6 month(s)  The format for your next appointment:   In Person  Provider:   Ronie Spies, PA-C, Nada Boozer, NP, Jacolyn Reedy, PA-C, Eligha Bridegroom, NP, or Tereso Newcomer, PA-C     Then, Donato Schultz, MD will plan to see you again in 1 year(s).   Thank you for choosing [facility]!!     I,Mathew Stumpf,acting as a scribe for Donato Schultz, MD.,have documented all relevant documentation on the behalf of Donato Schultz, MD,as directed by  Donato Schultz, MD while in the presence of Donato Schultz, MD.  I, Donato Schultz, MD, have reviewed all documentation for this visit. The documentation on 08/05/21 for the exam, diagnosis, procedures, and orders are all accurate and complete.   Signed, Donato Schultz, MD  08/05/2021 5:08 PM    [facility]

## 2021-08-05 NOTE — Assessment & Plan Note (Addendum)
As described under chronic diastolic heart failure.  Continue fluid balance with torsemide.  Also adequate blood pressure control with multidrug regimen.

## 2021-08-05 NOTE — Assessment & Plan Note (Addendum)
Continue with Xarelto.  Prior Holter monitor showed a very short episode of atrial fibrillation at 99 bpm.  Currently in sinus rhythm.

## 2021-08-05 NOTE — Assessment & Plan Note (Addendum)
Grade 2 diastolic dysfunction noted on prior echocardiogram.  Secondary pulmonary hypertension also noted.  Multidrug regimen continuing to be utilized including losartan 20 mg 40mg  (changed because creatinine at 1 point went up to 2.3, currently in the 1.5 range),  carvedilol 25 mg twice a day, Cardizem 240 daily.  It is certainly possible that some of her shortness of breath is fluid related.  When she becomes overloaded, this can cause shortness of breath.  I think taking an additional 20 mg makes sense during these episodes.  Spoke with family.

## 2021-09-01 ENCOUNTER — Other Ambulatory Visit: Payer: Self-pay

## 2021-09-01 MED ORDER — RIVAROXABAN 15 MG PO TABS: ORAL_TABLET | ORAL | 3 refills | Status: DC

## 2021-09-01 NOTE — Telephone Encounter (Signed)
Prescription refill request for Xarelto received.  Indication: Afib  Last office visit:08/05/21 (Skains)  Weight: 92.5kg Age:  87 Scr: 1.53 (01/22/21) CrCl: 37.25ml/min  Appropriate dose and refill sent to requested pharmacy.

## 2021-09-30 ENCOUNTER — Other Ambulatory Visit: Payer: Self-pay

## 2021-09-30 MED ORDER — RIVAROXABAN 15 MG PO TABS: ORAL_TABLET | ORAL | 1 refills | Status: DC

## 2021-09-30 MED ORDER — DILTIAZEM HCL ER COATED BEADS 240 MG PO CP24: 240.0000 mg | ORAL_CAPSULE | Freq: Every day | ORAL | 3 refills | Status: DC

## 2021-09-30 NOTE — Telephone Encounter (Signed)
Xarelto 15 mg refill request received. Pt is 87 years old, weight- 92.5 kg, Crea- 1.9 on 08/04/21, last seen by Dr. Anne Fu on 08/05/21, Diagnosis-PAF, CrCl- 30.46; Dose is appropriate based on dosing criteria. Will send in refill to requested pharmacy.

## 2021-10-31 ENCOUNTER — Ambulatory Visit: Payer: Medicare Other | Admitting: Physician Assistant

## 2022-02-02 ENCOUNTER — Telehealth: Payer: Self-pay | Admitting: Cardiology

## 2022-02-02 NOTE — Telephone Encounter (Signed)
T[place]ania from [facility] states Dr. Alvester Morin is requesting to speak with Dr. Marlou Porch.  ?

## 2022-02-02 NOTE — Telephone Encounter (Signed)
Dr Marlou Porch spoke with Dr Alvester Morin regarding this pt's care. No new orders given to this RN at this time.   ?

## 2022-06-29 ENCOUNTER — Other Ambulatory Visit: Payer: Self-pay | Admitting: Cardiology

## 2022-06-29 NOTE — Telephone Encounter (Signed)
Prescription refill request for Xarelto received.  Indication:Afib Last office visit:9/23 Weight:92.5 kg Age:88 Scr:2.0 CrCl:28.39  ml/min  Prescription refilled

## 2022-08-02 ENCOUNTER — Ambulatory Visit: Payer: Medicare HMO | Attending: Cardiology | Admitting: Cardiology

## 2022-08-02 ENCOUNTER — Encounter: Payer: Self-pay | Admitting: Cardiology

## 2022-08-02 VITALS — BP 140/80 | HR 52 | Ht 62.0 in | Wt 206.0 lb

## 2022-08-02 DIAGNOSIS — I480 Paroxysmal atrial fibrillation: Secondary | ICD-10-CM | POA: Diagnosis not present

## 2022-08-02 DIAGNOSIS — I5032 Chronic diastolic (congestive) heart failure: Secondary | ICD-10-CM

## 2022-08-02 MED ORDER — CARVEDILOL 6.25 MG PO TABS: 6.2500 mg | ORAL_TABLET | Freq: Two times a day (BID) | ORAL | 0 refills | Status: AC

## 2022-08-02 MED ORDER — CARVEDILOL 6.25 MG PO TABS: 6.2500 mg | ORAL_TABLET | Freq: Two times a day (BID) | ORAL | 0 refills | Status: DC

## 2022-08-02 NOTE — Patient Instructions (Signed)
Medication Instructions:  Take carvedilol 6.25 mg one tablet twice a day for 1 week then stop. Continue all other medications as listed.  *If you need a refill on your cardiac medications before your next appointment, please call your pharmacy*  Follow-Up: At [facility], you and your health needs are our priority.  As part of our continuing mission to provide you with exceptional heart care, we have created designated Provider Care Teams.  These Care Teams include your primary Cardiologist (physician) and Advanced Practice Providers (APPs -  Physician Assistants and Nurse Practitioners) who all work together to provide you with the care you need, when you need it.  We recommend signing up for the patient portal called "MyChart".  Sign up information is provided on this After Visit Summary.  MyChart is used to connect with patients for Virtual Visits (Telemedicine).  Patients are able to view lab/test results, encounter notes, upcoming appointments, etc.  Non-urgent messages can be sent to your provider as well.   To learn more about what you can do with MyChart, go to [email or web address].    Your next appointment:   6 month(s)  The format for your next appointment:   In Person  Provider:   Nicholes Rough, PA-C, Ambrose Pancoast, NP, Ermalinda Barrios, PA-C, Christen Bame, NP, or Richardson Dopp, PA-C          Important Information About Sugar

## 2022-08-02 NOTE — Progress Notes (Signed)
Cardiology Office Note:    Date:  08/02/2022   ID:  [name], DOB 09-09-1932, MRN [id number]  PCP:  John Giovanni, MD  [facility] Cardiologist:  Donato Schultz, MD  [facility] Electrophysiologist:  None   Referring MD: John Giovanni, MD    History of Present Illness:    [name] is a 88 y.o. female with PMHx of CAD, PAF, SVT, OSA on CPAP, CHF, HTN, HLD, and DM who presents to clinic today for the follow-up of CAD.  Diastolic heart failure, grade 2 diastolic dysfunction, first-degree AV block.  She called the office 08/04/2021 complaining of intermittent shortness of breath for 1 week prior. This would occur while she was up and moving. Current regimen includes 100 mg Torsemide daily and 100 mg Invokana daily. CXR performed 11/3 showed no acute findings.  Had SOB, went to ER 10/04/20. Takes torsemide 100mg . Accidentally took 20mg  pill for a while only.  Nephrology following. Now that she is back on the torsemide dose, she is feeling better.  Cardiac catheterization in 2019-normal coronary arteries  Paroxysmal atrial fibrillation-on Xarelto for chronic anticoagulation.  , her daughter accompanied her on last visit.  Darlene her other daughter came previously.  At her last visit with me, she was feeling better in regards to her shortness of breath. She noted some abdominal tightness which was most prominent during an episode of A-fib. She reported that she was alternating taking torsemide 20 mg one day, and 40 mg the next day. If she notices any edema, she was instructed to take 40 mg for a few days.   Today, she states that she has been fatigued. She reports that her Coreg was recently decreased to 12.5mg  but without much improvement.  She reports some wheezing, primarily at night when lying down. Her shortness of breath has continued to improve.  She denies any bleeding issues on Xarelto.  The patient denies chest pain, chest pressure, PND, orthopnea, or leg  swelling. Denies cough, fever, chills, nausea, or vomiting. Denies syncope, presyncope, or snoring. Denies dizziness or lightheadedness.   Patient states that her husband passed away in 07-09-22. He was dealing with dementia for quite some time.   Past Medical History:  Diagnosis Date   CAD (coronary artery disease)    a. Cath 06/23/11.  LAD 50 - 60% ostial, EF 65%;  b. 04/2013 Cath: LM nl, LAD 40-50 ost, LCX nl, OM1/2 nl, RCA/PDA/PLSA nl, EF 70%.// Cath Jul 09, 2018 >> Normal Coronary Ateries    Chronic diastolic CHF    High dose Torsemide   DJD (degenerative joint disease)    DM (diabetes mellitus) (HCC)    Dyspnea    chronic   GERD (gastroesophageal reflux disease)    Gout    HLD (hyperlipidemia)    HTN (hypertension)    Obesity    OSA on CPAP     Past Surgical History:  Procedure Laterality Date   BACK SURGERY     CARDIAC CATHETERIZATION  06-23-2011   left heart   CHOLECYSTECTOMY     LEFT HEART CATH AND CORONARY ANGIOGRAPHY N/A 06/21/2018   Procedure: LEFT HEART CATH AND CORONARY ANGIOGRAPHY;  Surgeon: 06-25-2011, MD;  Location: [facility] INVASIVE CV LAB;  Service: Cardiovascular;  Laterality: N/A;   LEFT HEART CATHETERIZATION WITH CORONARY ANGIOGRAM N/A 04/07/2013   Procedure: LEFT HEART CATHETERIZATION WITH CORONARY ANGIOGRAM;  Surgeon: Lennette Bihari, MD;  Location: [facility] CATH LAB;  Service: Cardiovascular;  Laterality: N/A;  TOTAL KNEE ARTHROPLASTY     bilateral    Current Medications: Current Meds  Medication Sig   acetaminophen (TYLENOL) 650 MG CR tablet Take 1,300 mg by mouth every 8 (eight) hours as needed for pain.   allopurinol (ZYLOPRIM) 100 MG tablet Take 100 mg by mouth daily.    canagliflozin (INVOKANA) 100 MG TABS tablet Take 100 mg by mouth daily before breakfast.   diltiazem (CARDIZEM CD) 240 MG 24 hr capsule Take 1 capsule (240 mg total) by mouth daily.   doxazosin (CARDURA) 1 MG tablet Take 0.5 mg by mouth daily.   famotidine (PEPCID) 20 MG tablet Take 20 mg  by mouth 2 (two) times daily.   levothyroxine (SYNTHROID) 75 MCG tablet Take 75 mcg by mouth daily.   losartan (COZAAR) 100 MG tablet Take 100 mg by mouth daily.     Multiple Vitamins-Minerals (PRESERVISION AREDS 2 PO) Take 1 capsule by mouth 2 (two) times daily.   nitroGLYCERIN (NITROSTAT) 0.4 MG SL tablet Place 0.4 mg under the tongue every 5 (five) minutes as needed for chest pain.   Polyethyl Glycol-Propyl Glycol 0.4-0.3 % SOLN Place 1 drop into both eyes 2 (two) times daily.   potassium chloride (K-DUR,KLOR-CON) 10 MEQ tablet Take 20 mEq by mouth 2 (two) times daily.    torsemide (DEMADEX) 20 MG tablet Take 20 mg by mouth as directed. Take 20 mg alternating with 40 mg every other day   XARELTO 15 MG TABS tablet TAKE 1 TABLET EVERY DAY WITH SUPPER   [DISCONTINUED] carvedilol (COREG) 25 MG tablet Take 1 tablet (25 mg total) by mouth 2 (two) times daily with a meal.   [DISCONTINUED] carvedilol (COREG) 6.25 MG tablet Take 1 tablet (6.25 mg total) by mouth 2 (two) times daily. Take one tablet twice a day for 1 week then stop     Allergies:   Patient has no known allergies.   Social History   Socioeconomic History   Marital status: Married    Spouse name: Not on file   Number of children: Not on file   Years of education: Not on file   Highest education level: Not on file  Occupational History   Not on file  Tobacco Use   Smoking status: Never   Smokeless tobacco: Never  Vaping Use   Vaping Use: Never used  Substance and Sexual Activity   Alcohol use: No   Drug use: No   Sexual activity: Not on file  Other Topics Concern   Not on file  Social History Narrative   Not on file   Social Determinants of Health   Financial Resource Strain: Not on file  Food Insecurity: Not on file  Transportation Needs: Not on file  Physical Activity: Not on file  Stress: Not on file  Social Connections: Not on file     Family History: The patient's family history includes Coronary artery  disease in her mother.  ROS:   Please see the history of present illness.    (+) Fatigue (+) Wheezing All other systems reviewed and are negative.  EKGs/Labs/Other Studies Reviewed:    The following studies were reviewed today:  Echo 06/19/2022 [facility]: Summary    1. The left ventricle is normal in size with normal wall thickness.    2. The left ventricular systolic function is normal, LVEF is visually  estimated at 60-65%.    3. There is grade II diastolic dysfunction (elevated filling pressure).    4. The right ventricle is normal  in size, with normal systolic function.    5. The left atrium is mildly dilated in size.    6. There is mild pulmonary hypertension.   XR Chest [facility]) 08/04/2021: COMPARISON: May 19. TECHNIQUE: PA and lateral views of the chest were performed.  FINDINGS:  Clear lungs with no pleural effusion. Stable minimal linear scar left base. Cardiomediastinal contour unremarkable with calcification aorta.  IMPRESSION:  No acute findings.  10/25/2020 ECHO [facility]):  1. The left ventricle is normal in size with mildly increased wall  thickness.    2. The left ventricular systolic function is normal, LVEF is visually  estimated at 60-65%.    3. There is grade II diastolic dysfunction (elevated filling pressure).    4. The right ventricle is normal in size, with normal systolic function.    5. The left atrium is mildly dilated in size.    6. There is trivial tricuspid regurgitation.    7. There is moderate pulmonary hypertension, estimated pulmonary artery  systolic pressure is 53 mmHg.    03/2019 - Holter monitor reviewed minimum heart rate 49, maximum 154, average 64.  Sinus rhythm predominant with first-degree AV block present occasionally.  She had 1 run of ventricular tachycardia lasting 8 beats at 154 bpm with an average of 132.  She had 37 supraventricular tachycardic runs with the fastest of 19 beats with a max of 139.  Atrial fibrillation  occurred less than 1% ranging from 71-1 41 with an average of 99 bpm the longest episode lasting 3 hours and 5 minutes.   Cath 06/21/18: Normal epicardial coronary arteries in a dominant RCA system. Hyperdynamic LV function with an ejection fraction at least 70% without wall motion abnormality.  LVEDP 23 mm Hg.   Nuclear Stress 06/17/2018: Nuclear stress EF: 79%. There was no ST segment deviation noted during stress. Defect 1: There is a large defect of mild severity present in the basal anterolateral, mid anterior, mid anteroseptal, mid anterolateral, apical anterior, apical septal and apical lateral location. This is a high risk study. Findings consistent with ischemia. The left ventricular ejection fraction is hyperdynamic (>65%). There is a large size, mild severity, reversible defect consistent with ischemia in the LAD territory (SDS =6). A cardiac catheterization is recommended.  EKG:  EKG is personally reviewed and interpreted.  08/02/2022: sinus bradycardia. Rate of 52bpm. 1st degree AV block. PR 08/05/2021: Sinus bradycardia. Rate 55 bpm. PAC. 11/02/2020: EKG was not ordered.  Recent Labs: No results found for requested labs within last 365 days.   Recent Lipid Panel    Component Value Date/Time   CHOL 157 06/23/2011 0218   TRIG 136 06/23/2011 0218   HDL 46 06/23/2011 0218   CHOLHDL 3.4 06/23/2011 0218   VLDL 27 06/23/2011 0218   LDLCALC 84 06/23/2011 0218     Risk Assessment/Calculations:      Physical Exam:    VS:  BP (!) 140/80 (BP Location: Left Arm, Patient Position: Sitting, Cuff Size: Normal)   Pulse (!) 52   Ht 5\' 2"  (1.575 m)   Wt 206 lb (93.4 kg)   LMP  (LMP Unknown)   BMI 37.68 kg/m     Wt Readings from Last 3 Encounters:  08/02/22 206 lb (93.4 kg)  08/05/21 204 lb (92.5 kg)  11/02/20 205 lb (93 kg)     GEN:  Well nourished, well developed in no acute distress HEENT: Normal NECK: No JVD; No carotid bruits LYMPHATICS: No  lymphadenopathy  CARDIAC: Regular rhythm, bradycardic rate, no murmurs, rubs, gallops RESPIRATORY:  Clear to auscultation without rales, wheezing or rhonchi  ABDOMEN: Soft, non-tender, non-distended MUSCULOSKELETAL: No edema; No deformity  SKIN: Warm and dry NEUROLOGIC:  Alert and oriented x 3 PSYCHIATRIC:  Normal affect   ASSESSMENT:    1. Paroxysmal atrial fibrillation (HCC)   2. Chronic diastolic heart failure (HCC)      PLAN:    In order of problems listed above:  Shortness of breath Multifactorial.  As described under chronic diastolic heart failure.  Continue fluid balance with torsemide.  Also adequate blood pressure control with multidrug regimen.  Echocardiogram overall reassuring.   Paroxysmal atrial fibrillation (HCC) Continue with Xarelto.  Prior Holter monitor showed a very short episode of atrial fibrillation at 99 bpm.  Currently in sinus rhythm.  No bleeding episodes.  Her blood work is being followed by Dr. Trilby Drummer.   Chronic diastolic heart failure (HCC) Grade 2 diastolic dysfunction noted on prior echocardiogram.  Secondary pulmonary hypertension also noted.  Multidrug regimen continuing to be utilized including losartan 100 mg (changed because creatinine at 1 point went up to 2.3, currently in the 1.5 range).  Her carvedilol was decreased to 12.5 mg twice a day from 25.  She still has bradycardia 52 and first-degree AV block of 200 ms.  We will go ahead and taper off her carvedilol.  This may help with her fatigue as well.  Continue with Cardizem 240 daily.   It is certainly possible that some of her shortness of breath is fluid related.  When she becomes overloaded, this can cause shortness of breath.  I think taking an additional 20 mg makes sense during these episodes.  Spoke with family.   Pure hypercholesterolemia Had stopped taking atorvastatin 10 mg a day.  Prior blood work by Dr. Trilby Drummer.   Follow-up:  6 months with APP  Medication Adjustments/Labs and  Tests Ordered: Current medicines are reviewed at length with the patient today.  Concerns regarding medicines are outlined above.   Orders Placed This Encounter  Procedures   EKG 12-Lead    Meds ordered this encounter  Medications   DISCONTD: carvedilol (COREG) 6.25 MG tablet    Sig: Take 1 tablet (6.25 mg total) by mouth 2 (two) times daily. Take one tablet twice a day for 1 week then stop    Dispense:  14 tablet    Refill:  0    Discontinue any other RX for carvedilol   carvedilol (COREG) 6.25 MG tablet    Sig: Take 1 tablet (6.25 mg total) by mouth 2 (two) times daily. Take one tablet twice a day for 1 week then stop    Dispense:  14 tablet    Refill:  0    Discontinue any other RX for carvedilol     Patient Instructions  Medication Instructions:  Take carvedilol 6.25 mg one tablet twice a day for 1 week then stop. Continue all other medications as listed.  *If you need a refill on your cardiac medications before your next appointment, please call your pharmacy*  Follow-Up: At [facility], you and your health needs are our priority.  As part of our continuing mission to provide you with exceptional heart care, we have created designated Provider Care Teams.  These Care Teams include your primary Cardiologist (physician) and Advanced Practice Providers (APPs -  Physician Assistants and Nurse Practitioners) who all work together to provide you with the care you need, when you need  it.  We recommend signing up for the patient portal called "MyChart".  Sign up information is provided on this After Visit Summary.  MyChart is used to connect with patients for Virtual Visits (Telemedicine).  Patients are able to view lab/test results, encounter notes, upcoming appointments, etc.  Non-urgent messages can be sent to your provider as well.   To learn more about what you can do with MyChart, go to [email or web address].    Your next appointment:   6 month(s)  The format for  your next appointment:   In Person  Provider:   Jari Favre, PA-C, Robin Searing, NP, Jacolyn Reedy, PA-C, Eligha Bridegroom, NP, or Tereso Newcomer, PA-C          Important Information About Sugar          I,Alexis Herring,acting as a scribe for Donato Schultz, MD.,have documented all relevant documentation on the behalf of Donato Schultz, MD,as directed by  Donato Schultz, MD while in the presence of Donato Schultz, MD.  I, Donato Schultz, MD, have reviewed all documentation for this visit. The documentation on 08/02/22 for the exam, diagnosis, procedures, and orders are all accurate and complete.   Signed, Donato Schultz, MD  08/02/2022 11:28 AM    [facility]

## 2022-10-04 ENCOUNTER — Other Ambulatory Visit: Payer: Self-pay | Admitting: Cardiology

## 2022-10-04 MED ORDER — DILTIAZEM HCL ER COATED BEADS 240 MG PO CP24: 240.0000 mg | ORAL_CAPSULE | Freq: Every day | ORAL | 3 refills | Status: DC

## 2022-10-13 ENCOUNTER — Other Ambulatory Visit: Payer: Self-pay

## 2022-10-13 DIAGNOSIS — I480 Paroxysmal atrial fibrillation: Secondary | ICD-10-CM

## 2022-10-13 MED ORDER — RIVAROXABAN 15 MG PO TABS: ORAL_TABLET | ORAL | 1 refills | Status: DC

## 2022-10-13 NOTE — Telephone Encounter (Signed)
Prescription refill request for Xarelto received.  Indication: Afib Last office visit: 08/02/22 Marlou Porch)  Weight: 93.4kg Age: 89 Scr: 2.20 (08/30/22)  CrCl: 26.34ml/min  Appropriate dose and refill sent to requested pharmacy.

## 2022-10-16 ENCOUNTER — Other Ambulatory Visit: Payer: Self-pay

## 2022-10-16 DIAGNOSIS — I480 Paroxysmal atrial fibrillation: Secondary | ICD-10-CM

## 2022-10-16 MED ORDER — RIVAROXABAN 15 MG PO TABS: ORAL_TABLET | ORAL | 3 refills | Status: DC

## 2023-03-07 NOTE — Progress Notes (Unsigned)
Office Visit    Patient Name: TEXANA HABLE Date of Encounter: 03/08/2023  Primary Care Provider:  John Giovanni, MD Primary Cardiologist:  Donato Schultz, MD Primary Electrophysiologist: None   Past Medical History    Past Medical History:  Diagnosis Date   CAD (coronary artery disease)    a. Cath 06/23/11.  LAD 50 - 60% ostial, EF 65%;  b. 04/2013 Cath: LM nl, LAD 40-50 ost, LCX nl, OM1/2 nl, RCA/PDA/PLSA nl, EF 70%.// Cath 06/2018 >> Normal Coronary Ateries    Chronic diastolic CHF    High dose Torsemide   DJD (degenerative joint disease)    DM (diabetes mellitus) (HCC)    Dyspnea    chronic   GERD (gastroesophageal reflux disease)    Gout    HLD (hyperlipidemia)    HTN (hypertension)    Obesity    OSA on CPAP    Past Surgical History:  Procedure Laterality Date   BACK SURGERY     CARDIAC CATHETERIZATION  06-23-2011   left heart   CHOLECYSTECTOMY     LEFT HEART CATH AND CORONARY ANGIOGRAPHY N/A 06/21/2018   Procedure: LEFT HEART CATH AND CORONARY ANGIOGRAPHY;  Surgeon: Lennette Bihari, MD;  Location: [facility];  Service: Cardiovascular;  Laterality: N/A;   LEFT HEART CATHETERIZATION WITH CORONARY ANGIOGRAM N/A 04/07/2013   Procedure: LEFT HEART CATHETERIZATION WITH CORONARY ANGIOGRAM;  Surgeon: Vesta Mixer, MD;  Location: [facility];  Service: Cardiovascular;  Laterality: N/A;   TOTAL KNEE ARTHROPLASTY     bilateral    Allergies  No Known Allergies   History of Present Illness    RENLEE ABILA  is a 89 year old female with a PMH of PAF on Xarelto, HTN, OSA on CPAP, obesity, non obstructive CAD, chronic diastolic CHF and obesity   Ms. Font was initially seen in 2012 for complaint of chest pain and underwent exercise Myoview that showed possible ischemia with normal EF.  She had a LHC performed that showed nonobstructive CAD.  She was seen in 2014 with complaint of midsternal chest pain and underwent left heart cath that showed nonobstructive CAD with  nonflow limiting LAD disease 40-50% and normal LV function.  She was treated with medical therapy.  She was diagnosed with atrial fibrillation in 2017 and was placed on Xarelto.  She has rate managed with Cardizem and wore an event monitor 06/2019 that showed predominantly sinus rhythm with AF lasting less than 1%.  She was last seen by Dr. Anne Fu on 08/02/2022 and reported doing well but noted some abdominal tightness which is prominent with episodes of atrial fibrillation.  She noted some shortness of breath that was found to be multifactorial and patient noted bradycardia and carvedilol was tapered off.  Ms. Trach presents today for 62-month follow-up with her daughter.  Since last being seen in the office patient reports she has been doing well but still experiencing shortness of breath with activity and while walking up inclines.  She denies any abdominal pain or episodes of atrial fibrillation.  Her blood pressure today is well-controlled at 130/60 and heart rate is 73 bpm.  She suffered a fall in March and fell on 1 knee and also hit her head and elbow.  She was not evaluated for head injury in the ED and was advised that if future falls occur she should be seen due to her Xarelto she is now using a cane and is better balance and ambulation.  She was seen in May by her PCP due to increased lower extremity swelling and her torsemide was switched to 60 mg daily.  She is euvolemic and stable she stays hydrated with at least 64 ounces of water daily.  Patient denies chest pain, palpitations, dyspnea, PND, orthopnea, nausea, vomiting, dizziness, syncope, edema, weight gain, or early satiety.   Home Medications    Current Outpatient Medications  Medication Sig Dispense Refill   acetaminophen (TYLENOL) 650 MG CR tablet Take 1,300 mg by mouth every 8 (eight) hours as needed for pain.     albuterol (VENTOLIN HFA) 108 (90 Base) MCG/ACT inhaler Inhale 2 puffs into the lungs every 6 (six) hours as needed for  shortness of breath or wheezing.     allopurinol (ZYLOPRIM) 100 MG tablet Take 100 mg by mouth daily.      carvedilol (COREG) 6.25 MG tablet Take 1 tablet (6.25 mg total) by mouth 2 (two) times daily. Take one tablet twice a day for 1 week then stop 14 tablet 0   diltiazem (CARDIZEM CD) 240 MG 24 hr capsule Take 1 capsule (240 mg total) by mouth daily. 90 capsule 3   famotidine (PEPCID) 20 MG tablet Take 20 mg by mouth 2 (two) times daily.     fluticasone (FLONASE) 50 MCG/ACT nasal spray Place 2 sprays into both nostrils daily.     levothyroxine (SYNTHROID) 75 MCG tablet Take 75 mcg by mouth daily.     Multiple Vitamins-Minerals (PRESERVISION AREDS 2 PO) Take 1 capsule by mouth 2 (two) times daily.     nitroGLYCERIN (NITROSTAT) 0.4 MG SL tablet Place 0.4 mg under the tongue every 5 (five) minutes as needed for chest pain.     Polyethyl Glycol-Propyl Glycol 0.4-0.3 % SOLN Place 1 drop into both eyes 2 (two) times daily.     potassium chloride (K-DUR,KLOR-CON) 10 MEQ tablet Take 20 mEq by mouth 2 (two) times daily.      pravastatin (PRAVACHOL) 40 MG tablet Take 20 mg by mouth daily. Pt takes 1/2 tablet 20 mg once daily     Rivaroxaban (XARELTO) 15 MG TABS tablet TAKE 1 TABLET EVERY DAY WITH SUPPER 90 tablet 3   sertraline (ZOLOFT) 25 MG tablet Take 1 tablet by mouth daily.     telmisartan (MICARDIS) 80 MG tablet Take 80 mg by mouth daily.     torsemide (DEMADEX) 20 MG tablet Take 20 mg by mouth daily. Pt takes 3 tablets 80 mg once a day.     doxazosin (CARDURA) 1 MG tablet Take 0.5 mg by mouth daily. (Patient not taking: Reported on 03/08/2023)     No current facility-administered medications for this visit.     Review of Systems  Please see the history of present illness.    (+) Trace lower extremity swelling (+) Shortness of breath with exertion  All other systems reviewed and are otherwise negative except as noted above.  Physical Exam    Wt Readings from Last 3 Encounters:  03/08/23  208 lb (94.3 kg)  08/02/22 206 lb (93.4 kg)  08/05/21 204 lb (92.5 kg)   VS: Vitals:   03/08/23 1055  BP: 130/60  Pulse: 73  SpO2: 97%  ,Body mass index is 38.04 kg/m.  Constitutional:      Appearance: Healthy appearance. Not in distress.  Neck:     Vascular: JVD normal.  Pulmonary:     Effort: Pulmonary effort is normal.     Breath sounds: No wheezing. No rales. Diminished in  the bases Cardiovascular:     Normal rate. Regular rhythm. Normal S1. Normal S2.      Murmurs: There is no murmur.  Edema:    Peripheral edema absent.  Abdominal:     Palpations: Abdomen is soft non tender. There is no hepatomegaly.  Skin:    General: Skin is warm and dry.  Neurological:     General: No focal deficit present.     Mental Status: Alert and oriented to person, place and time.     Cranial Nerves: Cranial nerves are intact.  EKG/LABS/ Recent Cardiac Studies    ECG personally reviewed by me today -none completed today  Cardiac Studies & Procedures   CARDIAC CATHETERIZATION  CARDIAC CATHETERIZATION 06/21/2018  Narrative  The left ventricular ejection fraction is greater than 65% by visual estimate.  The left ventricular systolic function is normal.  LV end diastolic pressure is normal.  Normal epicardial coronary arteries in a dominant RCA system.  Hyperdynamic LV function with an ejection fraction at least 70% without wall motion abnormality.  LVEDP 23 mm Hg.  RECOMMENDATION: Medical therapy in this patient with a history of hypertension, hyperlipidemia, and PAF.  Suspect breast attenuation artifact contributing to her scintigraphic abnormality on nuclear imaging.  Recommend to resume Rivaroxaban, at currently prescribed dose and frequency, on 06/22/2018.  Findings Coronary Findings Diagnostic  Dominance: Right  No diagnostic findings have been documented. Intervention  No interventions have been documented.   STRESS TESTS  MYOCARDIAL PERFUSION IMAGING  06/17/2018  Narrative  Nuclear stress EF: 79%.  There was no ST segment deviation noted during stress.  Defect 1: There is a large defect of mild severity present in the basal anterolateral, mid anterior, mid anteroseptal, mid anterolateral, apical anterior, apical septal and apical lateral location.  This is a high risk study.  Findings consistent with ischemia.  The left ventricular ejection fraction is hyperdynamic (>65%).  There is a large size, mild severity, reversible defect consistent with ischemia in the LAD territory (SDS =6). A cardiac catheterization is recommended.     MONITORS  LONG TERM MONITOR (3-14 DAYS) 04/23/2019           Risk Assessment/Calculations:    CHA2DS2-VASc Score = 5   This indicates a 7.2% annual risk of stroke. The patient's score is based upon: CHF History: 1 HTN History: 1 Diabetes History: 0 Stroke History: 0 Vascular Disease History: 0 Age Score: 2 Gender Score: 1           Lab Results  Component Value Date   WBC 6.0 06/18/2018   HGB 11.9 06/18/2018   HCT 34.5 06/18/2018   MCV 93 06/18/2018   PLT 226 06/18/2018   Lab Results  Component Value Date   CREATININE 1.29 (H) 06/18/2018   BUN 20 06/18/2018   NA 139 06/18/2018   K 4.5 06/18/2018   CL 102 06/18/2018   CO2 23 06/18/2018   Lab Results  Component Value Date   ALT 21 06/14/2007   AST 30 06/14/2007   ALKPHOS 50 06/14/2007   BILITOT 0.5 06/14/2007   Lab Results  Component Value Date   CHOL 157 06/23/2011   HDL 46 06/23/2011   LDLCALC 84 06/23/2011   TRIG 136 06/23/2011   CHOLHDL 3.4 06/23/2011    Lab Results  Component Value Date   HGBA1C 6.5 (H) 06/22/2011     Assessment & Plan    1.  Nonobstructive CAD: -s/p LHC in 2012 and 2014 with nonobstructive CAD noted managed  with medical therapy. -Today patient reports she is not suffered any chest pain or anginal equivalent since her previous visit. -Continue carvedilol 6.25 mg twice dail, pravastatin 20  mg daily  2.  Paroxysmal atrial fibrillation: -Patient is currently rate controlled with Cardizem 240 mg and carvedilol 6.25 mg twice daily -Patient's creatinine clearance is 25 mL/min -Continue Xarelto 15 mg daily  3.  HFpEF: -Patient's last 2D echo completed 2022 noting EF of 60 to 65% and grade 2 DD with moderate pulmonary hypertension -Today patient reports she is experiencing increased swelling and torsemide was increased to 60 mg daily by her PCP. -She has trace lower extremity edema today but otherwise is euvolemic. -Continue carvedilol 6.25 mg twice daily, potassium 10 mEq twice daily telmisartan 80 mg daily, torsemide 60 mg daily -Low sodium diet, fluid restriction <2L, and daily weights encouraged. Educated to contact our office for weight gain of 2 lbs overnight or 5 lbs in one week.   4.  DM type II: -Patient's hemoglobin A1c is 6.9 -Continue treatment plan per PCP  5.  Hypercholesteremia: -Patient's LDL cholesterol was 90 -Continue pravastatin 20 mg daily      Disposition: Follow-up with Donato Schultz, MD or APP in 6 months    Medication Adjustments/Labs and Tests Ordered: Current medicines are reviewed at length with the patient today.  Concerns regarding medicines are outlined above.   Signed, Napoleon Form, Leodis Rains, NP 03/08/2023, 12:42 PM [facility]

## 2023-03-08 ENCOUNTER — Encounter: Payer: Self-pay | Admitting: Nurse Practitioner

## 2023-03-08 ENCOUNTER — Ambulatory Visit: Payer: Medicare HMO | Attending: Nurse Practitioner | Admitting: Nurse Practitioner

## 2023-03-08 VITALS — BP 130/60 | HR 73 | Ht 62.0 in | Wt 208.0 lb

## 2023-03-08 DIAGNOSIS — I480 Paroxysmal atrial fibrillation: Secondary | ICD-10-CM | POA: Diagnosis not present

## 2023-03-08 DIAGNOSIS — I5032 Chronic diastolic (congestive) heart failure: Secondary | ICD-10-CM | POA: Diagnosis not present

## 2023-03-08 DIAGNOSIS — E7800 Pure hypercholesterolemia, unspecified: Secondary | ICD-10-CM | POA: Diagnosis not present

## 2023-03-08 DIAGNOSIS — I2510 Atherosclerotic heart disease of native coronary artery without angina pectoris: Secondary | ICD-10-CM

## 2023-03-08 DIAGNOSIS — E1169 Type 2 diabetes mellitus with other specified complication: Secondary | ICD-10-CM

## 2023-03-08 NOTE — Patient Instructions (Signed)
Medication Instructions:  Your physician recommends that you continue on your current medications as directed. Please refer to the Current Medication list given to you today.  *If you need a refill on your cardiac medications before your next appointment, please call your pharmacy*   Lab Work: None Ordered   Testing/Procedures: None Ordered   Follow-Up: At [facility], you and your health needs are our priority.  As part of our continuing mission to provide you with exceptional heart care, we have created designated Provider Care Teams.  These Care Teams include your primary Cardiologist (physician) and Advanced Practice Providers (APPs -  Physician Assistants and Nurse Practitioners) who all work together to provide you with the care you need, when you need it.  We recommend signing up for the patient portal called "MyChart".  Sign up information is provided on this After Visit Summary.  MyChart is used to connect with patients for Virtual Visits (Telemedicine).  Patients are able to view lab/test results, encounter notes, upcoming appointments, etc.  Non-urgent messages can be sent to your provider as well.   To learn more about what you can do with MyChart, go to [email or web address].    Your next appointment:   6 month(s)  Provider:   Donato Schultz, MD     Other Instructions  Check your weight daily

## 2023-08-01 ENCOUNTER — Other Ambulatory Visit: Payer: Self-pay | Admitting: Cardiology

## 2023-09-17 ENCOUNTER — Ambulatory Visit: Payer: Medicare HMO | Attending: Cardiology | Admitting: Cardiology

## 2023-09-17 ENCOUNTER — Other Ambulatory Visit: Payer: Self-pay | Admitting: Cardiology

## 2023-09-17 DIAGNOSIS — I480 Paroxysmal atrial fibrillation: Secondary | ICD-10-CM

## 2023-09-17 NOTE — Telephone Encounter (Signed)
Prescription refill request for Xarelto received.  Indication: Afib  Last office visit: 03/08/23 [name])  Weight: 94.3kg Age: 89 Scr: 2.2 (09/11/23)  CrCl: 25.61ml/min  Appropriate dose. Refill sent.
